# Patient Record
Sex: Female | Born: 1970
Health system: Southern US, Community
[De-identification: ages and names within clinical notes are randomized; demographics above are authoritative.]

## PROBLEM LIST (undated history)

## (undated) DIAGNOSIS — N809 Endometriosis, unspecified: Secondary | ICD-10-CM

## (undated) DIAGNOSIS — N926 Irregular menstruation, unspecified: Secondary | ICD-10-CM

## (undated) DIAGNOSIS — Z8742 Personal history of other diseases of the female genital tract: Secondary | ICD-10-CM

## (undated) DIAGNOSIS — M1990 Unspecified osteoarthritis, unspecified site: Secondary | ICD-10-CM

## (undated) HISTORY — PX: OTHER SURGICAL HISTORY: SHX169

## (undated) HISTORY — PX: DILATION AND CURETTAGE OF UTERUS: SHX78

## (undated) HISTORY — DX: Unspecified osteoarthritis, unspecified site: M19.90

## (undated) HISTORY — DX: Personal history of other diseases of the female genital tract: Z87.42

## (undated) HISTORY — DX: Endometriosis, unspecified: N80.9

## (undated) HISTORY — PX: GALLBLADDER SURGERY: SHX652

## (undated) HISTORY — DX: Irregular menstruation, unspecified: N92.6

---

## 1991-10-07 HISTORY — PX: CHOLECYSTECTOMY: SHX55

## 2001-06-14 ENCOUNTER — Ambulatory Visit (HOSPITAL_COMMUNITY): Admission: RE | Admit: 2001-06-14 | Discharge: 2001-06-14 | Payer: Self-pay | Admitting: Obstetrics and Gynecology

## 2001-06-14 ENCOUNTER — Encounter: Payer: Self-pay | Admitting: Obstetrics and Gynecology

## 2001-08-02 ENCOUNTER — Ambulatory Visit (HOSPITAL_COMMUNITY): Admission: RE | Admit: 2001-08-02 | Discharge: 2001-08-02 | Payer: Self-pay | Admitting: Obstetrics and Gynecology

## 2001-08-02 ENCOUNTER — Encounter: Payer: Self-pay | Admitting: Obstetrics and Gynecology

## 2001-12-31 ENCOUNTER — Encounter (HOSPITAL_COMMUNITY): Admission: AD | Admit: 2001-12-31 | Discharge: 2002-01-04 | Payer: Self-pay | Admitting: Obstetrics and Gynecology

## 2002-01-05 ENCOUNTER — Inpatient Hospital Stay (HOSPITAL_COMMUNITY): Admission: AD | Admit: 2002-01-05 | Discharge: 2002-01-08 | Payer: Self-pay | Admitting: Obstetrics and Gynecology

## 2003-03-27 ENCOUNTER — Other Ambulatory Visit: Admission: RE | Admit: 2003-03-27 | Discharge: 2003-03-27 | Payer: Self-pay | Admitting: Family Medicine

## 2004-03-27 ENCOUNTER — Other Ambulatory Visit: Admission: RE | Admit: 2004-03-27 | Discharge: 2004-03-27 | Payer: Self-pay | Admitting: Internal Medicine

## 2005-03-19 ENCOUNTER — Ambulatory Visit: Payer: Self-pay | Admitting: Family Medicine

## 2005-03-28 ENCOUNTER — Ambulatory Visit: Payer: Self-pay | Admitting: Family Medicine

## 2005-03-28 ENCOUNTER — Other Ambulatory Visit: Admission: RE | Admit: 2005-03-28 | Discharge: 2005-03-28 | Payer: Self-pay | Admitting: Family Medicine

## 2006-03-31 ENCOUNTER — Other Ambulatory Visit: Admission: RE | Admit: 2006-03-31 | Discharge: 2006-03-31 | Payer: Self-pay | Admitting: Internal Medicine

## 2006-03-31 ENCOUNTER — Ambulatory Visit: Payer: Self-pay | Admitting: Family Medicine

## 2007-03-16 ENCOUNTER — Telehealth (INDEPENDENT_AMBULATORY_CARE_PROVIDER_SITE_OTHER): Payer: Self-pay | Admitting: *Deleted

## 2007-03-31 ENCOUNTER — Ambulatory Visit: Payer: Self-pay | Admitting: Internal Medicine

## 2007-04-06 ENCOUNTER — Ambulatory Visit: Payer: Self-pay | Admitting: Internal Medicine

## 2008-04-13 ENCOUNTER — Ambulatory Visit: Payer: Self-pay | Admitting: Internal Medicine

## 2009-04-27 ENCOUNTER — Ambulatory Visit: Payer: Self-pay | Admitting: Internal Medicine

## 2010-05-07 ENCOUNTER — Ambulatory Visit: Payer: Self-pay | Admitting: Internal Medicine

## 2011-02-21 NOTE — Discharge Summary (Signed)
[facility]  Patient:    Sydney French, Sydney French Visit Number: [id number] MRN: [id number]          Service Type: OBS Location: [id number] Attending Physician:  Oliver Pila Dictated by:   Alvino Chapel, M.D. Admit Date:  01/05/2002 Discharge Date: 01/08/2002                             Discharge Summary  DISCHARGE DIAGNOSES:          1. Term pregnancy at 40 weeks, delivered.                               2. Status post vaginal birth after cesarean                                  section.  DISCHARGE MEDICATIONS:        1. Motrin 600 mg p.o. every six hours.                               2. Percocet one to two tablets every four                                  hours p.r.n.  DISCHARGE FOLLOWUP:           The patient is to follow up in approximately six weeks for her routine postpartum exam.  HOSPITAL COURSE:              The patient is a 40 year old, G7, P3-0-3-3, who was admitted at 40 weeks with complaint of contractions every three to five minutes for several hours. She had had no leakage of fluid and her cervical exam on admission was completely effaced, 6 cm and a 0 station. The patient was admitted and received her epidural anesthesia. Pregnancy had been complicated only by a history of a low transverse cesarean section with a successful VBAC after that.  PRENATAL LABORATORY DATA:     O positive, antibody negative, RPR nonreactive, rubella immune, hepatitis B surface antigen negative, GC negative, Chlamydia negative, GBS negative. One-hour glucola 124 and triple screen normal.  PAST OBSTETRIC HISTORY:       In 1993 she had a cesarean section for a breech baby at 6 pounds. In 1994 she had a spontaneous miscarriage. In 1995 she had a successful VBAC of a 7-pound 5-ounce infant. In 1997 she had a spontaneous miscarriage. In 1998 she had a successful VBAC of an 8-pound 2-ounce infant. In 2001 she had a spontaneous abortion.  PAST  GYNECOLOGIC HISTORY:     None.  PAST SURGICAL HISTORY:        C-section and cholecystectomy.  PAST MEDICAL HISTORY:         None.  HOSPITAL COURSE:              On admission she was afebrile with stable vital signs. Fetal heart rate was reactive. Cervix quickly progressed to completely dilated and completely effaced and +1 station. She had rupture of membranes with clear fluid shortly after admission and pushed well with normal spontaneous vaginal delivery of a vigorous female infant over a small second degree laceration. Apgars were 9  and 9. Weight was 8 pounds 5 ounces. Placenta delivered spontaneously. Second degree laceration was repaired with 2-0 Vicryl. Cervix and rectum were intact. The patient did very well postpartum and on postpartum day #2 was afebrile with stable vital signs and was felt stable for discharge home. Dictated by:   Alvino Chapel, M.D. Attending Physician:  Oliver Pila DD:  01/08/02 TD:  01/09/02 Job: [id number]

## 2011-05-14 ENCOUNTER — Ambulatory Visit: Payer: Self-pay | Admitting: Internal Medicine

## 2012-05-19 ENCOUNTER — Ambulatory Visit: Payer: Self-pay | Admitting: Internal Medicine

## 2013-06-10 ENCOUNTER — Ambulatory Visit: Payer: Self-pay | Admitting: Internal Medicine

## 2014-06-27 ENCOUNTER — Ambulatory Visit: Payer: Self-pay | Admitting: Internal Medicine

## 2014-07-04 ENCOUNTER — Ambulatory Visit: Payer: Self-pay | Admitting: Internal Medicine

## 2015-06-19 ENCOUNTER — Other Ambulatory Visit: Payer: Self-pay | Admitting: Internal Medicine

## 2015-06-19 DIAGNOSIS — Z1231 Encounter for screening mammogram for malignant neoplasm of breast: Secondary | ICD-10-CM

## 2015-07-02 ENCOUNTER — Ambulatory Visit
Admission: RE | Admit: 2015-07-02 | Discharge: 2015-07-02 | Disposition: A | Discharge status: Home / Self Care | Payer: BLUE CROSS/BLUE SHIELD | Source: Ambulatory Visit | Attending: Internal Medicine | Admitting: Internal Medicine

## 2015-07-02 DIAGNOSIS — Z1231 Encounter for screening mammogram for malignant neoplasm of breast: Secondary | ICD-10-CM | POA: Diagnosis present

## 2016-02-22 DIAGNOSIS — N925 Other specified irregular menstruation: Secondary | ICD-10-CM | POA: Diagnosis not present

## 2016-02-22 DIAGNOSIS — J029 Acute pharyngitis, unspecified: Secondary | ICD-10-CM | POA: Diagnosis not present

## 2016-02-22 DIAGNOSIS — N924 Excessive bleeding in the premenopausal period: Secondary | ICD-10-CM | POA: Diagnosis not present

## 2016-02-22 DIAGNOSIS — D509 Iron deficiency anemia, unspecified: Secondary | ICD-10-CM | POA: Diagnosis not present

## 2016-02-27 DIAGNOSIS — N924 Excessive bleeding in the premenopausal period: Secondary | ICD-10-CM | POA: Diagnosis not present

## 2016-06-17 ENCOUNTER — Other Ambulatory Visit: Payer: Self-pay | Admitting: Nurse Practitioner

## 2016-06-17 DIAGNOSIS — H60553 Acute reactive otitis externa, bilateral: Secondary | ICD-10-CM | POA: Diagnosis not present

## 2016-06-17 DIAGNOSIS — Z0001 Encounter for general adult medical examination with abnormal findings: Secondary | ICD-10-CM | POA: Diagnosis not present

## 2016-06-17 DIAGNOSIS — N924 Excessive bleeding in the premenopausal period: Secondary | ICD-10-CM | POA: Diagnosis not present

## 2016-06-17 DIAGNOSIS — D509 Iron deficiency anemia, unspecified: Secondary | ICD-10-CM | POA: Diagnosis not present

## 2016-06-17 DIAGNOSIS — Z1231 Encounter for screening mammogram for malignant neoplasm of breast: Secondary | ICD-10-CM

## 2016-06-17 DIAGNOSIS — Z124 Encounter for screening for malignant neoplasm of cervix: Secondary | ICD-10-CM | POA: Diagnosis not present

## 2016-06-18 DIAGNOSIS — N924 Excessive bleeding in the premenopausal period: Secondary | ICD-10-CM | POA: Diagnosis not present

## 2016-07-01 ENCOUNTER — Ambulatory Visit: Payer: BLUE CROSS/BLUE SHIELD

## 2016-07-16 ENCOUNTER — Ambulatory Visit
Admission: RE | Admit: 2016-07-16 | Discharge: 2016-07-16 | Disposition: A | Discharge status: Home / Self Care | Payer: BLUE CROSS/BLUE SHIELD | Source: Ambulatory Visit | Attending: Nurse Practitioner | Admitting: Nurse Practitioner

## 2016-07-16 DIAGNOSIS — Z1231 Encounter for screening mammogram for malignant neoplasm of breast: Secondary | ICD-10-CM | POA: Diagnosis not present

## 2016-07-18 DIAGNOSIS — D509 Iron deficiency anemia, unspecified: Secondary | ICD-10-CM | POA: Diagnosis not present

## 2016-07-18 DIAGNOSIS — N924 Excessive bleeding in the premenopausal period: Secondary | ICD-10-CM | POA: Diagnosis not present

## 2016-08-18 DIAGNOSIS — L700 Acne vulgaris: Secondary | ICD-10-CM | POA: Diagnosis not present

## 2016-08-18 DIAGNOSIS — D2261 Melanocytic nevi of right upper limb, including shoulder: Secondary | ICD-10-CM | POA: Diagnosis not present

## 2016-08-18 DIAGNOSIS — D2262 Melanocytic nevi of left upper limb, including shoulder: Secondary | ICD-10-CM | POA: Diagnosis not present

## 2016-08-18 DIAGNOSIS — D225 Melanocytic nevi of trunk: Secondary | ICD-10-CM | POA: Diagnosis not present

## 2016-08-18 DIAGNOSIS — L570 Actinic keratosis: Secondary | ICD-10-CM | POA: Diagnosis not present

## 2016-12-09 DIAGNOSIS — N760 Acute vaginitis: Secondary | ICD-10-CM | POA: Diagnosis not present

## 2016-12-09 DIAGNOSIS — R300 Dysuria: Secondary | ICD-10-CM | POA: Diagnosis not present

## 2017-04-20 ENCOUNTER — Ambulatory Visit: Payer: Self-pay | Admitting: Medical

## 2017-04-20 ENCOUNTER — Encounter: Payer: Self-pay | Admitting: Medical

## 2017-04-20 VITALS — BP 102/76 | HR 98 | Temp 99.1°F

## 2017-04-20 DIAGNOSIS — B349 Viral infection, unspecified: Secondary | ICD-10-CM

## 2017-04-20 NOTE — Progress Notes (Signed)
   Subjective:    Patient ID: Sydney French, female    DOB: 1971-05-12, 46 y.o.   MRN: [id number]  HPI 46  Yo started about one week ago. Runny nose clear coughing some phelgm but not coughing it out, sneezing , fever Friday night but did not take the temperature, had chills. Taking Mucinex max strength with some relief. Flying out this weekend for a reunion and does not want to be sick. Using  Etodolac 500 mg aching and pains , helps. She states she has mild bodyaches and a mild sore throat.   Review of Systems  Constitutional: Positive for appetite change, chills, fatigue and fever.  HENT: Positive for rhinorrhea, sinus pressure, sneezing, sore throat and voice change. Negative for congestion, ear pain and sinus pain.   Eyes: Negative for discharge and itching.  Respiratory: Positive for cough and shortness of breath.   Cardiovascular: Negative for chest pain.  Gastrointestinal: Negative for abdominal pain.  Genitourinary: Negative for dysuria and hematuria.  Musculoskeletal: Positive for myalgias.  Skin: Negative for rash.  Neurological: Negative for dizziness and syncope.  Hematological: Positive for adenopathy.   No shortness of breath now. Drinking more and urinating more.   felt a little  "out of it last night at the game". Objective:   Physical Exam  Constitutional: She is oriented to person, place, and time. She appears well-developed and well-nourished.  HENT:  Head: Normocephalic and atraumatic.  Right Ear: Hearing, external ear and ear canal normal.  Left Ear: Hearing, external ear and ear canal normal. A middle ear effusion is present.  Mouth/Throat: Uvula is midline and mucous membranes are normal. Posterior oropharyngeal erythema present. No oropharyngeal exudate or posterior oropharyngeal edema.  Eyes: Pupils are equal, round, and reactive to light. Conjunctivae and EOM are normal.  Neck: Normal range of motion. Neck supple.  Cardiovascular: Normal rate and regular  rhythm.  Exam reveals no gallop and no friction rub.   No murmur heard. Pulmonary/Chest: Effort normal and breath sounds normal.  Musculoskeletal: Normal range of motion.  Lymphadenopathy:    She has cervical adenopathy.  Neurological: She is alert and oriented to person, place, and time.  Skin: Skin is warm and dry.  Psychiatric: She has a normal mood and affect. Her behavior is normal. Judgment and thought content normal.  Nursing note and vitals reviewed.    Wax obscuring  1/2 of the left TM , wax obscuring all the right TM  Pharynx very edge of posterior pharynx slightly red.     Assessment & Plan:   Viral infection. Rest , increase fluids, OTC Motrin or Tylenol, take as directed.  Reviewed with patient to stop the Max strength Mucinex causing mouth to be dry. Try OTC Zyrtec or Claritin take as directed.  Return in  4 days if not improving.  Cerumen impaction Recommended OTC Debrox otic drops  5-10 drops to each ear twice daily x  4 days rinse in shower.

## 2017-04-20 NOTE — Patient Instructions (Signed)
Viral Illness, Adult Viruses are tiny germs that can get into a person's body and cause illness. There are many different types of viruses, and they cause many types of illness. Viral illnesses can range from mild to severe. They can affect various parts of the body. Common illnesses that are caused by a virus include colds and the flu. Viral illnesses also include serious conditions such as HIV/AIDS (human immunodeficiency virus/acquired immunodeficiency syndrome). A few viruses have been linked to certain cancers. What are the causes? Many types of viruses can cause illness. Viruses invade cells in your body, multiply, and cause the infected cells to malfunction or die. When the cell dies, it releases more of the virus. When this happens, you develop symptoms of the illness, and the virus continues to spread to other cells. If the virus takes over the function of the cell, it can cause the cell to divide and grow out of control, as is the case when a virus causes cancer. Different viruses get into the body in different ways. You can get a virus by:  Swallowing food or water that is contaminated with the virus.  Breathing in droplets that have been coughed or sneezed into the air by an infected person.  Touching a surface that has been contaminated with the virus and then touching your eyes, nose, or mouth.  Being bitten by an insect or animal that carries the virus.  Having sexual contact with a person who is infected with the virus.  Being exposed to blood or fluids that contain the virus, either through an open cut or during a transfusion.  If a virus enters your body, your body's defense system (immune system) will try to fight the virus. You may be at higher risk for a viral illness if your immune system is weak. What are the signs or symptoms? Symptoms vary depending on the type of virus and the location of the cells that it invades. Common symptoms of the main types of viral illnesses  include: Cold and flu viruses  Fever.  Headache.  Sore throat.  Muscle aches.  Nasal congestion.  Cough. Digestive system (gastrointestinal) viruses  Fever.  Abdominal pain.  Nausea.  Diarrhea. Liver viruses (hepatitis)  Loss of appetite.  Tiredness.  Yellowing of the skin (jaundice). Brain and spinal cord viruses  Fever.  Headache.  Stiff neck.  Nausea and vomiting.  Confusion or sleepiness. Skin viruses  Warts.  Itching.  Rash. Sexually transmitted viruses  Discharge.  Swelling.  Redness.  Rash. How is this treated? Viruses can be difficult to treat because they live within cells. Antibiotic medicines do not treat viruses because these drugs do not get inside cells. Treatment for a viral illness may include:  Resting and drinking plenty of fluids.  Medicines to relieve symptoms. These can include over-the-counter medicine for pain and fever, medicines for cough or congestion, and medicines to relieve diarrhea.  Antiviral medicines. These drugs are available only for certain types of viruses. They may help reduce flu symptoms if taken early. There are also many antiviral medicines for hepatitis and HIV/AIDS.  Some viral illnesses can be prevented with vaccinations. A common example is the flu shot. Follow these instructions at home: Medicines   Take over-the-counter and prescription medicines only as told by your health care provider.  If you were prescribed an antiviral medicine, take it as told by your health care provider. Do not stop taking the medicine even if you start to feel better.  Be aware   of when antibiotics are needed and when they are not needed. Antibiotics do not treat viruses. If your health care provider thinks that you may have a bacterial infection as well as a viral infection, you may get an antibiotic. ? Do not ask for an antibiotic prescription if you have been diagnosed with a viral illness. That will not make your  illness go away faster. ? Frequently taking antibiotics when they are not needed can lead to antibiotic resistance. When this develops, the medicine no longer works against the bacteria that it normally fights. General instructions  Drink enough fluids to keep your urine clear or pale yellow.  Rest as much as possible.  Return to your normal activities as told by your health care provider. Ask your health care provider what activities are safe for you.  Keep all follow-up visits as told by your health care provider. This is important. How is this prevented? Take these actions to reduce your risk of viral infection:  Eat a healthy diet and get enough rest.  Wash your hands often with soap and water. This is especially important when you are in public places. If soap and water are not available, use hand sanitizer.  Avoid close contact with friends and family who have a viral illness.  If you travel to areas where viral gastrointestinal infection is common, avoid drinking water or eating raw food.  Keep your immunizations up to date. Get a flu shot every year as told by your health care provider.  Do not share toothbrushes, nail clippers, razors, or needles with other people.  Always practice safe sex.  Contact a health care provider if:  You have symptoms of a viral illness that do not go away.  Your symptoms come back after going away.  Your symptoms get worse. Get help right away if:  You have trouble breathing.  You have a severe headache or a stiff neck.  You have severe vomiting or abdominal pain. This information is not intended to replace advice given to you by your health care provider. Make sure you discuss any questions you have with your health care provider. Document Released: 02/01/2016 Document Revised: 03/05/2016 Document Reviewed: 02/01/2016 Elsevier Interactive Patient Education  2018 Elsevier Inc.  

## 2017-07-30 DIAGNOSIS — F411 Generalized anxiety disorder: Secondary | ICD-10-CM | POA: Diagnosis not present

## 2017-07-30 DIAGNOSIS — R109 Unspecified abdominal pain: Secondary | ICD-10-CM | POA: Diagnosis not present

## 2017-07-30 DIAGNOSIS — Z0001 Encounter for general adult medical examination with abnormal findings: Secondary | ICD-10-CM | POA: Diagnosis not present

## 2017-07-30 DIAGNOSIS — Z124 Encounter for screening for malignant neoplasm of cervix: Secondary | ICD-10-CM | POA: Diagnosis not present

## 2017-08-03 ENCOUNTER — Other Ambulatory Visit: Payer: Self-pay | Admitting: Nurse Practitioner

## 2017-08-03 DIAGNOSIS — Z1231 Encounter for screening mammogram for malignant neoplasm of breast: Secondary | ICD-10-CM

## 2017-08-13 DIAGNOSIS — Z0001 Encounter for general adult medical examination with abnormal findings: Secondary | ICD-10-CM | POA: Diagnosis not present

## 2017-08-13 DIAGNOSIS — E559 Vitamin D deficiency, unspecified: Secondary | ICD-10-CM | POA: Diagnosis not present

## 2017-08-13 DIAGNOSIS — D509 Iron deficiency anemia, unspecified: Secondary | ICD-10-CM | POA: Diagnosis not present

## 2017-08-18 ENCOUNTER — Ambulatory Visit
Admission: RE | Admit: 2017-08-18 | Discharge: 2017-08-18 | Disposition: A | Discharge status: Home / Self Care | Payer: BLUE CROSS/BLUE SHIELD | Source: Ambulatory Visit | Attending: Nurse Practitioner | Admitting: Nurse Practitioner

## 2017-08-18 DIAGNOSIS — Z1231 Encounter for screening mammogram for malignant neoplasm of breast: Secondary | ICD-10-CM | POA: Diagnosis not present

## 2017-08-24 DIAGNOSIS — Z8742 Personal history of other diseases of the female genital tract: Secondary | ICD-10-CM | POA: Diagnosis not present

## 2017-08-24 DIAGNOSIS — R1031 Right lower quadrant pain: Secondary | ICD-10-CM | POA: Diagnosis not present

## 2017-10-28 ENCOUNTER — Ambulatory Visit: Payer: Self-pay | Admitting: *Deleted

## 2017-10-28 DIAGNOSIS — Z0000 Encounter for general adult medical examination without abnormal findings: Secondary | ICD-10-CM

## 2017-10-29 ENCOUNTER — Other Ambulatory Visit: Payer: Self-pay

## 2017-10-29 MED ORDER — FERROUS FUMARATE 324 (106 FE) MG PO TABS: 1.0000 | ORAL_TABLET | Freq: Every day | ORAL | 3 refills | Status: DC

## 2018-01-20 ENCOUNTER — Telehealth: Payer: Self-pay | Admitting: Medical

## 2018-01-20 NOTE — Telephone Encounter (Signed)
Called patient to review patient travel paperwork and refer onto travel clinic.

## 2018-01-20 NOTE — Telephone Encounter (Signed)
Called and left message for her to return my phone call , to review her travel  [place]. And refer her to a travel clinic.

## 2018-03-05 ENCOUNTER — Encounter: Payer: Self-pay | Admitting: Nurse Practitioner

## 2018-03-05 ENCOUNTER — Other Ambulatory Visit: Payer: Self-pay | Admitting: Nurse Practitioner

## 2018-03-05 ENCOUNTER — Ambulatory Visit: Payer: BLUE CROSS/BLUE SHIELD | Admitting: Nurse Practitioner

## 2018-03-05 VITALS — BP 119/79 | HR 83 | Resp 16 | Ht 64.0 in | Wt 111.8 lb

## 2018-03-05 DIAGNOSIS — D259 Leiomyoma of uterus, unspecified: Secondary | ICD-10-CM

## 2018-03-05 DIAGNOSIS — R809 Proteinuria, unspecified: Secondary | ICD-10-CM | POA: Diagnosis not present

## 2018-03-05 DIAGNOSIS — N390 Urinary tract infection, site not specified: Secondary | ICD-10-CM | POA: Diagnosis not present

## 2018-03-05 DIAGNOSIS — R300 Dysuria: Secondary | ICD-10-CM

## 2018-03-05 DIAGNOSIS — R7301 Impaired fasting glucose: Secondary | ICD-10-CM | POA: Diagnosis not present

## 2018-03-05 DIAGNOSIS — N959 Unspecified menopausal and perimenopausal disorder: Secondary | ICD-10-CM | POA: Diagnosis not present

## 2018-03-05 DIAGNOSIS — N926 Irregular menstruation, unspecified: Secondary | ICD-10-CM

## 2018-03-05 LAB — POCT URINALYSIS DIPSTICK
Blood, UA: NEGATIVE
Glucose, UA: NEGATIVE
LEUKOCYTES UA: NEGATIVE
NITRITE UA: NEGATIVE
PH UA: 5 (ref 5.0–8.0)
Protein, UA: POSITIVE — AB
SPEC GRAV UA: 1.01 (ref 1.010–1.025)
UROBILINOGEN UA: 0.2 U/dL

## 2018-03-05 NOTE — Progress Notes (Signed)
[facility] [place], [place] [address]  Internal MEDICINE  Office Visit Note  Patient Name: Sydney French  [id number]  [id number]  Date of Service: 03/24/2018  Chief Complaint  Patient presents with  . Abdominal Pain    occassional urgency off and on for about two weeks  . Hot Flashes    frequently going on for about a month  . Nail Problem    concerned about both pinky toes not sure if she nees to put something on nails for fungus  . Immunizations    traveling and wants to get some vaccinations before traveling outside of the country     The patient will be travelling to [place] in 2 weeks. She will need vritten prescriptions for hepatitis A vaccine series. She also needs this for vaccine for Typhoid. Would like to have prescriptions for both the IM injection and the oral vaccine. She is unsure of which vaccine the health center will be able to get for her.   Abdominal Pain  This is a new problem. The current episode started 1 to 4 weeks ago. The onset quality is gradual. The problem occurs intermittently (gradually becoming more frequent). The problem has been gradually worsening. The pain is located in the RLQ. The pain is at a severity of 3/10. The pain is mild. The quality of the pain is aching and cramping. The abdominal pain does not radiate. Associated symptoms include dysuria, frequency, headaches and nausea. Pertinent negatives include no arthralgias, constipation, diarrhea, fever, flatus or vomiting. Nothing aggravates the pain. The pain is relieved by nothing.        Current Medication:  Outpatient Encounter Medications as of 03/05/2018  Medication Sig  . etodolac (LODINE) 500 MG tablet Take 500 mg by mouth daily.  Marland Kitchen [place] FAST-MAX 5-10-200-325 MG CAPS Take by mouth.  . [DISCONTINUED] Ferrous Fumarate (HEMOCYTE) 324 (106 Fe) MG TABS tablet Take 1 tablet (106 mg of iron total) by mouth daily.  . nitrofurantoin, macrocrystal-monohydrate,  (MACROBID) 100 MG capsule Take 1 capsule (100 mg total) by mouth 2 (two) times daily.   No facility-administered encounter medications on file as of 03/05/2018.       Medical History: Past Medical History:  Diagnosis Date  . Osteoarthritis      Today's Vitals   03/05/18 1140  BP: 119/79  Pulse: 83  Resp: 16  SpO2: 100%  Weight: 111 lb 12.8 oz (50.7 kg)  Height: 5\' 4"  (1.626 m)    Review of Systems  Constitutional: Positive for fatigue. Negative for chills, fever and unexpected weight change.  HENT: Negative for congestion, postnasal drip, rhinorrhea, sneezing and sore throat.   Eyes: Negative.  Negative for redness.  Respiratory: Negative for cough, chest tightness, shortness of breath and wheezing.   Cardiovascular: Negative for chest pain and palpitations.  Gastrointestinal: Positive for abdominal pain and nausea. Negative for constipation, diarrhea, flatus and vomiting.  Endocrine: Positive for heat intolerance.  Genitourinary: Positive for dysuria, frequency and menstrual problem.  Musculoskeletal: Negative for arthralgias, back pain, joint swelling and neck pain.  Skin: Negative for rash.  Allergic/Immunologic: Negative.   Neurological: Positive for headaches. Negative for tremors and numbness.  Hematological: Negative for adenopathy. Does not bruise/bleed easily.  Psychiatric/Behavioral: Negative for behavioral problems (Depression), sleep disturbance and suicidal ideas. The patient is not nervous/anxious.     Physical Exam  Constitutional: She is oriented to person, place, and time. She appears well-developed and well-nourished. No distress.  HENT:  Head:  Normocephalic and atraumatic.  Mouth/Throat: Oropharynx is clear and moist. No oropharyngeal exudate.  Eyes: Pupils are equal, round, and reactive to light. EOM are normal.  Neck: Normal range of motion. Neck supple. No JVD present. No tracheal deviation present. No thyromegaly present.  Cardiovascular: Normal  rate, regular rhythm and normal heart sounds. Exam reveals no gallop and no friction rub.  No murmur heard. Pulmonary/Chest: Effort normal and breath sounds normal. No respiratory distress. She has no wheezes. She has no rales. She exhibits no tenderness.  Abdominal: Soft. Normal appearance and bowel sounds are normal.  Genitourinary:  Genitourinary Comments: U/a positive for large amount of protein.   Musculoskeletal: Normal range of motion.  Lymphadenopathy:    She has no cervical adenopathy.  Neurological: She is alert and oriented to person, place, and time. No cranial nerve deficit.  Skin: Skin is warm and dry. She is not diaphoretic.  Psychiatric: She has a normal mood and affect. Her behavior is normal. Judgment and thought content normal.  Nursing note and vitals reviewed.  Assessment/Plan: 1. Urinary tract infection without hematuria, site unspecified Start macrobid 100mg  twice daily for 10 days, send urine for culture and sensitivity and adjust antibiotics as indicated.  - CULTURE, URINE COMPREHENSIVE - nitrofurantoin, macrocrystal-monohydrate, (MACROBID) 100 MG capsule; Take 1 capsule (100 mg total) by mouth 2 (two) times daily.  Dispense: 20 capsule; Refill: 0  2. Dysuria - POCT Urinalysis Dipstick  3. Proteinuria, unspecified type Moderate protein in u/a today. Treat for infection. Recheck urine at next visit.   4. Unspecified menopausal and perimenopausal disorder Check reproductive hormones.   5. Irregular menstrual cycle Check reproductive hormones.   6. Leiomyoma of body of uterus Will get pelvic ultrasound for further evaluation. Refer to GYN as indicated.  - US Transvaginal Non-OB; Future  General Counseling: lakynn halvorsen understanding of the findings of todays visit and agrees with plan of treatment. I have discussed any further diagnostic evaluation that may be needed or ordered today. We also reviewed her medications today. she has been encouraged to  call the office with any questions or concerns that should arise related to todays visit.    Counseling:  This patient was seen by Leretha Pol, FNP- C in Collaboration with Dr Lavera Guise as a part of collaborative care agreement   Orders Placed This Encounter  Procedures  . CULTURE, URINE COMPREHENSIVE  . US Transvaginal Non-OB  . POCT Urinalysis Dipstick    Meds ordered this encounter  Medications  . nitrofurantoin, macrocrystal-monohydrate, (MACROBID) 100 MG capsule    Sig: Take 1 capsule (100 mg total) by mouth 2 (two) times daily.    Dispense:  20 capsule    Refill:  0    Order Specific Question:   Supervising Provider    Answer:   Lavera Guise [[id number]]    Time spent: 20 Minutes

## 2018-03-06 LAB — CBC
HEMATOCRIT: 37.7 % (ref 34.0–46.6)
Hemoglobin: 12.2 g/dL (ref 11.1–15.9)
MCH: 29.5 pg (ref 26.6–33.0)
MCHC: 32.4 g/dL (ref 31.5–35.7)
MCV: 91 fL (ref 79–97)
Platelets: 270 10*3/uL (ref 150–450)
RBC: 4.14 x10E6/uL (ref 3.77–5.28)
RDW: 14.4 % (ref 12.3–15.4)
WBC: 6.1 10*3/uL (ref 3.4–10.8)

## 2018-03-06 LAB — BASIC METABOLIC PANEL
BUN / CREAT RATIO: 21 (ref 9–23)
BUN: 13 mg/dL (ref 6–24)
CO2: 24 mmol/L (ref 20–29)
Calcium: 9.6 mg/dL (ref 8.7–10.2)
Chloride: 102 mmol/L (ref 96–106)
Creatinine, Ser: 0.63 mg/dL (ref 0.57–1.00)
GFR, EST AFRICAN AMERICAN: 122 mL/min/{1.73_m2} (ref >59)
GFR, EST NON AFRICAN AMERICAN: 106 mL/min/{1.73_m2} (ref >59)
Glucose: 148 mg/dL — ABNORMAL HIGH (ref 65–99)
POTASSIUM: 4.1 mmol/L (ref 3.5–5.2)
SODIUM: 140 mmol/L (ref 134–144)

## 2018-03-06 LAB — ESTRADIOL

## 2018-03-06 LAB — T4, FREE: Free T4: 1.35 ng/dL (ref 0.82–1.77)

## 2018-03-06 LAB — FSH/LH
FSH: 115.8 m[IU]/mL
LH: 55.4 m[IU]/mL

## 2018-03-06 LAB — PROLACTIN: Prolactin: 9.1 ng/mL (ref 4.8–23.3)

## 2018-03-06 LAB — TSH: TSH: 0.591 u[IU]/mL (ref 0.450–4.500)

## 2018-03-08 ENCOUNTER — Ambulatory Visit: Payer: Self-pay | Admitting: Medical

## 2018-03-08 VITALS — Temp 98.7°F

## 2018-03-09 LAB — CULTURE, URINE COMPREHENSIVE

## 2018-03-09 MED ORDER — HEPATITIS A VACCINE 1440 EL U/ML IM SUSP
1.0000 mL | Freq: Once | INTRAMUSCULAR | Status: AC
Start: 2018-03-08 — End: 2018-03-08
  Administered 2018-03-08: 1440 [IU] via INTRAMUSCULAR

## 2018-03-09 MED ORDER — TYPHOID VI POLYSACCHARIDE VACC 25 MCG/0.5ML IM SOLN
0.5000 mL | Freq: Once | INTRAMUSCULAR | Status: AC
  Administered 2018-03-08: 0.5 mL via INTRAMUSCULAR

## 2018-03-10 MED ORDER — NITROFURANTOIN MONOHYD MACRO 100 MG PO CAPS: 100.0000 mg | ORAL_CAPSULE | Freq: Two times a day (BID) | ORAL | 0 refills | Status: DC

## 2018-03-10 NOTE — Progress Notes (Signed)
Hey. Can you see if they can add HgbA1c to these labs.  Dx is R73.01. Sydney French

## 2018-03-11 ENCOUNTER — Telehealth: Payer: Self-pay | Admitting: Nurse Practitioner

## 2018-03-11 NOTE — Telephone Encounter (Signed)
-----   Message from Ronnell Freshwater, NP sent at 03/10/2018  3:13 PM EDT ----- [name]. Can you see if they can add HgbA1c to these labs.  Dx is R73.01. Toya Smothers

## 2018-03-11 NOTE — Telephone Encounter (Signed)
Left message for pt informing her that we are waiting for one more result from her blood work and we will call her back with the results as soon as they are in and reviewed by the doctor

## 2018-03-11 NOTE — Telephone Encounter (Signed)
Called lab and asked to add A1c to order.

## 2018-03-11 NOTE — Telephone Encounter (Signed)
-----   Message from Ronnell Freshwater, NP sent at 03/10/2018  3:17 PM EDT ----- Please let the patient know that urine did show evidence of mild infection. I added dose of macrobid 100mg  twice daily to her meds and I sent this to the pharmacy. Other labs look ok. Hormone levels do indicate menopause. thanks

## 2018-03-11 NOTE — Telephone Encounter (Signed)
Pt informed of her urine results

## 2018-03-11 NOTE — Telephone Encounter (Signed)
I am waiting on one more result.

## 2018-03-12 ENCOUNTER — Other Ambulatory Visit: Payer: Self-pay

## 2018-03-12 MED ORDER — FERROUS FUMARATE 324 (106 FE) MG PO TABS: 1.0000 | ORAL_TABLET | Freq: Every day | ORAL | 3 refills | Status: DC

## 2018-03-13 LAB — SPECIMEN STATUS REPORT

## 2018-03-13 LAB — HGB A1C W/O EAG: HEMOGLOBIN A1C: 5.5 % (ref 4.8–5.6)

## 2018-03-17 ENCOUNTER — Telehealth: Payer: Self-pay | Admitting: Nurse Practitioner

## 2018-03-17 NOTE — Progress Notes (Signed)
Hey. Can you let the patient know that labs do indicate menopauses. Other labs were good. Sugar was a little high, but HgbA1c does not indicate diabetes. Thanks.

## 2018-03-17 NOTE — Telephone Encounter (Signed)
-----   Message from Ronnell Freshwater, NP sent at 03/17/2018  9:04 AM EDT ----- [name]. Can you let the patient know that labs do indicate menopauses. Other labs were good. Sugar was a little high, but HgbA1c does not indicate diabetes. Thanks.

## 2018-03-17 NOTE — Telephone Encounter (Signed)
-----   Message from Sydney Freshwater, NP sent at 03/17/2018  9:04 AM EDT ----- [name]. Can you let the patient know that labs do indicate menopauses. Other labs were good. Sugar was a little high, but HgbA1c does not indicate diabetes. Thanks.

## 2018-03-17 NOTE — Telephone Encounter (Signed)
Lm for patient to return call.

## 2018-03-18 NOTE — Telephone Encounter (Signed)
Please let her know that I would like her to try Amberen which is over the counter to help women suffering from menopausal symptoms. Many of my patients have really liked this. Has helped tremendously without hormones or use of antidepressants. Thanks.

## 2018-03-18 NOTE — Telephone Encounter (Signed)
Pt called and was advised on otc medication

## 2018-03-24 DIAGNOSIS — N926 Irregular menstruation, unspecified: Secondary | ICD-10-CM | POA: Insufficient documentation

## 2018-03-24 DIAGNOSIS — R809 Proteinuria, unspecified: Secondary | ICD-10-CM | POA: Insufficient documentation

## 2018-03-24 DIAGNOSIS — N959 Unspecified menopausal and perimenopausal disorder: Secondary | ICD-10-CM | POA: Insufficient documentation

## 2018-03-24 DIAGNOSIS — R300 Dysuria: Secondary | ICD-10-CM | POA: Insufficient documentation

## 2018-03-24 DIAGNOSIS — N390 Urinary tract infection, site not specified: Secondary | ICD-10-CM | POA: Insufficient documentation

## 2018-03-24 DIAGNOSIS — D259 Leiomyoma of uterus, unspecified: Secondary | ICD-10-CM | POA: Insufficient documentation

## 2018-04-15 ENCOUNTER — Other Ambulatory Visit: Payer: Self-pay | Admitting: Nurse Practitioner

## 2018-04-15 DIAGNOSIS — R102 Pelvic and perineal pain: Secondary | ICD-10-CM

## 2018-04-16 ENCOUNTER — Other Ambulatory Visit: Payer: Self-pay

## 2018-04-20 ENCOUNTER — Ambulatory Visit: Payer: BLUE CROSS/BLUE SHIELD

## 2018-07-19 ENCOUNTER — Other Ambulatory Visit: Payer: Self-pay | Admitting: Nurse Practitioner

## 2018-08-03 ENCOUNTER — Ambulatory Visit (INDEPENDENT_AMBULATORY_CARE_PROVIDER_SITE_OTHER): Payer: BLUE CROSS/BLUE SHIELD | Admitting: Nurse Practitioner

## 2018-08-03 ENCOUNTER — Encounter: Payer: Self-pay | Admitting: Nurse Practitioner

## 2018-08-03 VITALS — BP 129/77 | HR 82 | Resp 16 | Ht 64.0 in | Wt 113.2 lb

## 2018-08-03 DIAGNOSIS — Z0001 Encounter for general adult medical examination with abnormal findings: Secondary | ICD-10-CM | POA: Diagnosis not present

## 2018-08-03 DIAGNOSIS — D259 Leiomyoma of uterus, unspecified: Secondary | ICD-10-CM

## 2018-08-03 DIAGNOSIS — Z124 Encounter for screening for malignant neoplasm of cervix: Secondary | ICD-10-CM | POA: Diagnosis not present

## 2018-08-03 DIAGNOSIS — R300 Dysuria: Secondary | ICD-10-CM

## 2018-08-03 DIAGNOSIS — Z1239 Encounter for other screening for malignant neoplasm of breast: Secondary | ICD-10-CM

## 2018-08-03 DIAGNOSIS — D509 Iron deficiency anemia, unspecified: Secondary | ICD-10-CM

## 2018-08-03 DIAGNOSIS — N926 Irregular menstruation, unspecified: Secondary | ICD-10-CM

## 2018-08-03 DIAGNOSIS — M150 Primary generalized (osteo)arthritis: Secondary | ICD-10-CM

## 2018-08-03 MED ORDER — ETODOLAC 500 MG PO TABS: 500.0000 mg | ORAL_TABLET | Freq: Every day | ORAL | 3 refills | Status: DC

## 2018-08-03 MED ORDER — FERROUS FUMARATE 324 (106 FE) MG PO TABS: 1.0000 | ORAL_TABLET | Freq: Every day | ORAL | 5 refills | Status: DC

## 2018-08-03 NOTE — Progress Notes (Signed)
[facility] [place], [place] [address]  Internal MEDICINE  Office Visit Note  Patient Name: Sydney French  [id number]  [id number]  Date of Service: 08/04/2018   Pt is here for routine health maintenance examination   Chief Complaint  Patient presents with  . Annual Exam    pt has had abdominal pain that occasionally comes, pt states that when she push on it it becomes more painful. pt has a cold that has been lingering for a few days, been taking OTC medication  . Gynecologic Exam  . Quality Metric Gaps    booster shot, pt will get flu vaccine in a few months     The patient travelled to [place] this summer. She did receive the first part of hepatitis A vaccine series. She is due to have a booster for this in December. She will get tis at [facility], where she works. She plans to get a flu shot at the same time.  She continues to have right lower quadrant and suprapubic tenderness, which is intermittent in nature. Worse around the times she gets a menstrual cycle. Cycles are getting further apart. Some are very light, however some are very heavy in nature. Prior labs did indicate that she is perimenopausal. She has started spotting this morning. She was sent up to have a transvaginal pelvic ultrasound and this had to be cancelled. She has had this rescheduled.   Abdominal Pain  This is a recurrent problem. The current episode started more than 1 month ago. The onset quality is gradual. The problem occurs intermittently (gradually becoming more frequent). The problem has been gradually worsening. The pain is located in the RLQ and suprapubic region. The pain is at a severity of 3/10. The pain is mild. The quality of the pain is aching and cramping. The abdominal pain does not radiate. Associated symptoms include headaches and nausea. Pertinent negatives include no arthralgias, constipation, diarrhea, dysuria, fever, flatus, frequency or vomiting. Nothing aggravates  the pain. The pain is relieved by nothing. Treatments tried: ibuprofen which has helped some.  The treatment provided mild relief.    Current Medication: Outpatient Encounter Medications as of 08/03/2018  Medication Sig  . diphenhydrAMINE HCl (BENADRYL ALLERGY PO) Take by mouth.  Marland Kitchen ibuprofen (ADVIL) 200 MG tablet Take 200 mg by mouth every 6 (six) hours as needed.  . etodolac (LODINE) 500 MG tablet Take 1 tablet (500 mg total) by mouth daily.  . Ferrous Fumarate (HEMOCYTE) 324 (106 Fe) MG TABS tablet Take 1 tablet (106 mg of iron total) by mouth daily.  Marland Kitchen [place] FAST-MAX 5-10-200-325 MG CAPS Take by mouth.  . nitrofurantoin, macrocrystal-monohydrate, (MACROBID) 100 MG capsule Take 1 capsule (100 mg total) by mouth 2 (two) times daily. (Patient not taking: Reported on 08/03/2018)  . [DISCONTINUED] etodolac (LODINE) 500 MG tablet Take 500 mg by mouth daily.  . [DISCONTINUED] Ferrous Fumarate (HEMOCYTE) 324 (106 Fe) MG TABS tablet Take 1 tablet (106 mg of iron total) by mouth daily.   No facility-administered encounter medications on file as of 08/03/2018.     Surgical History: Past Surgical History:  Procedure Laterality Date  . child birth natural     1995, 1998, 2003  . CHOLECYSTECTOMY  1993    Medical History: Past Medical History:  Diagnosis Date  . Osteoarthritis     Family History: Family History  Problem Relation Age of Onset  . Asthma Neg Hx   . Breast cancer Neg Hx   .  Stroke Neg Hx   . Glaucoma Neg Hx   . Arthritis Neg Hx       Review of Systems  Constitutional: Negative for chills, fatigue, fever and unexpected weight change.  HENT: Negative for congestion, postnasal drip, rhinorrhea, sneezing and sore throat.   Eyes: Negative.  Negative for redness.  Respiratory: Negative for cough, chest tightness, shortness of breath and wheezing.   Cardiovascular: Negative for chest pain and palpitations.  Gastrointestinal: Positive for abdominal pain and nausea.  Negative for constipation, diarrhea, flatus and vomiting.       Intermittent.   Endocrine: Positive for heat intolerance. Negative for cold intolerance, polydipsia, polyphagia and polyuria.  Genitourinary: Positive for menstrual problem. Negative for dysuria and frequency.  Musculoskeletal: Negative for arthralgias, back pain, joint swelling and neck pain.  Skin: Negative for rash.  Allergic/Immunologic: Negative.   Neurological: Positive for headaches. Negative for tremors and numbness.  Hematological: Negative for adenopathy. Does not bruise/bleed easily.  Psychiatric/Behavioral: Negative for behavioral problems (Depression), sleep disturbance and suicidal ideas. The patient is not nervous/anxious.     Today's Vitals   08/03/18 1148  BP: 129/77  Pulse: 82  Resp: 16  SpO2: 100%  Weight: 113 lb 3.2 oz (51.3 kg)  Height: 5\' 4"  (1.626 m)    Physical Exam  Constitutional: She is oriented to person, place, and time. She appears well-developed and well-nourished. No distress.  HENT:  Head: Normocephalic and atraumatic.  Nose: Nose normal.  Mouth/Throat: Oropharynx is clear and moist. No oropharyngeal exudate.  Eyes: Pupils are equal, round, and reactive to light. Conjunctivae and EOM are normal.  Neck: Normal range of motion. Neck supple. No JVD present. No tracheal deviation present. No thyromegaly present.  Cardiovascular: Normal rate, regular rhythm, normal heart sounds and intact distal pulses. Exam reveals no gallop and no friction rub.  No murmur heard. Pulmonary/Chest: Effort normal and breath sounds normal. No respiratory distress. She has no wheezes. She has no rales. She exhibits no tenderness. Right breast exhibits no inverted nipple, no mass, no nipple discharge, no skin change and no tenderness. Left breast exhibits no inverted nipple, no mass, no nipple discharge, no skin change and no tenderness.  Abdominal: Soft. Normal appearance and bowel sounds are normal. There is  tenderness in the right lower quadrant and suprapubic area. There is no rigidity, no rebound and no guarding.  Genitourinary: Vagina normal and uterus normal.  Genitourinary Comments: No tenderness, masses, or organomeglay present during bimanual exam . Vaginal bleeding is present.   Musculoskeletal: Normal range of motion.  Lymphadenopathy:    She has no cervical adenopathy.  Neurological: She is alert and oriented to person, place, and time. No cranial nerve deficit.  Skin: Skin is warm and dry. Capillary refill takes less than 2 seconds. She is not diaphoretic.  Psychiatric: She has a normal mood and affect. Her behavior is normal. Judgment and thought content normal.  Nursing note and vitals reviewed.    LABS: Recent Results (from the past 2160 hour(s))  UA/M w/rflx Culture, Routine     Status: None   Collection Time: 08/03/18 11:23 AM  Result Value Ref Range   Specific Gravity, UA 1.020 1.005 - 1.030   pH, UA 6.0 5.0 - 7.5   Color, UA Yellow Yellow   Appearance Ur Clear Clear   Leukocytes, UA Negative Negative   Protein, UA Negative Negative/Trace   Glucose, UA Negative Negative   Ketones, UA Negative Negative   RBC, UA Negative Negative  Bilirubin, UA Negative Negative   Urobilinogen, Ur 0.2 0.2 - 1.0 mg/dL   Nitrite, UA Negative Negative   Microscopic Examination Comment     Comment: Microscopic follows if indicated.   Microscopic Examination See below:     Comment: Microscopic was indicated and was performed.   Urinalysis Reflex Comment     Comment: This specimen will not reflex to a Urine Culture.  Microscopic Examination     Status: None   Collection Time: 08/03/18 11:23 AM  Result Value Ref Range   WBC, UA 0-5 0 - 5 /hpf   RBC, UA 0-2 0 - 2 /hpf   Epithelial Cells (non renal) 0-10 0 - 10 /hpf   Casts None seen None seen /lpf   Mucus, UA Present Not Estab.   Bacteria, UA Few None seen/Few   Assessment/Plan:   1. Encounter for general adult medical  examination with abnormal findings Annual health maintenance exam today  2. Irregular menstrual cycle Review prior labs indicating perimenopause. Will get ultrasound rescheduled and review when results are available.   3. Leiomyoma of body of uterus Will get pelvic ultrasound rescheduled.   4. Iron deficiency anemia, unspecified iron deficiency anemia type - Ferrous Fumarate (HEMOCYTE) 324 (106 Fe) MG TABS tablet; Take 1 tablet (106 mg of iron total) by mouth daily.  Dispense: 30 tablet; Refill: 5  5. Primary generalized (osteo)arthritis - etodolac (LODINE) 500 MG tablet; Take 1 tablet (500 mg total) by mouth daily.  Dispense: 60 tablet; Refill: 3  6. Screening for malignant neoplasm of cervix - Pap IG and HPV (high risk) DNA detection  7. Screening for breast cancer - MM DIGITAL SCREENING BILATERAL; Future  8. Dysuria - UA/M w/rflx Culture, Routine  General Counseling: Maisley verbalizes understanding of the findings of todays visit and agrees with plan of treatment. I have discussed any further diagnostic evaluation that may be needed or ordered today. We also reviewed her medications today. she has been encouraged to call the office with any questions or concerns that should arise related to todays visit.    Counseling:  This patient was seen by Leretha Pol FNP Collaboration with Dr Lavera Guise as a part of collaborative care agreement  Orders Placed This Encounter  Procedures  . Microscopic Examination  . MM DIGITAL SCREENING BILATERAL  . UA/M w/rflx Culture, Routine    Meds ordered this encounter  Medications  . etodolac (LODINE) 500 MG tablet    Sig: Take 1 tablet (500 mg total) by mouth daily.    Dispense:  60 tablet    Refill:  3    Order Specific Question:   Supervising Provider    Answer:   Lavera Guise [[id number]]  . Ferrous Fumarate (HEMOCYTE) 324 (106 Fe) MG TABS tablet    Sig: Take 1 tablet (106 mg of iron total) by mouth daily.    Dispense:  30 tablet     Refill:  5    Order Specific Question:   Supervising Provider    Answer:   Lavera Guise [[id number]]    Time spent: [place], MD  Internal Medicine

## 2018-08-04 DIAGNOSIS — M150 Primary generalized (osteo)arthritis: Secondary | ICD-10-CM | POA: Insufficient documentation

## 2018-08-04 DIAGNOSIS — Z124 Encounter for screening for malignant neoplasm of cervix: Secondary | ICD-10-CM | POA: Insufficient documentation

## 2018-08-04 DIAGNOSIS — Z1239 Encounter for other screening for malignant neoplasm of breast: Secondary | ICD-10-CM | POA: Insufficient documentation

## 2018-08-04 DIAGNOSIS — D509 Iron deficiency anemia, unspecified: Secondary | ICD-10-CM | POA: Insufficient documentation

## 2018-08-04 DIAGNOSIS — Z0001 Encounter for general adult medical examination with abnormal findings: Secondary | ICD-10-CM | POA: Insufficient documentation

## 2018-08-04 LAB — UA/M W/RFLX CULTURE, ROUTINE
BILIRUBIN UA: NEGATIVE
GLUCOSE, UA: NEGATIVE
Ketones, UA: NEGATIVE
LEUKOCYTES UA: NEGATIVE
Nitrite, UA: NEGATIVE
Protein, UA: NEGATIVE
RBC, UA: NEGATIVE
SPEC GRAV UA: 1.02 (ref 1.005–1.030)
Urobilinogen, Ur: 0.2 mg/dL (ref 0.2–1.0)
pH, UA: 6 (ref 5.0–7.5)

## 2018-08-04 LAB — MICROSCOPIC EXAMINATION: Casts: NONE SEEN /lpf

## 2018-08-07 LAB — PAP IG AND HPV HIGH-RISK: HPV, HIGH-RISK: NEGATIVE

## 2018-08-11 ENCOUNTER — Ambulatory Visit: Payer: BLUE CROSS/BLUE SHIELD

## 2018-08-24 ENCOUNTER — Ambulatory Visit
Admission: RE | Admit: 2018-08-24 | Discharge: 2018-08-24 | Disposition: A | Discharge status: Home / Self Care | Payer: BLUE CROSS/BLUE SHIELD | Source: Ambulatory Visit | Attending: Nurse Practitioner | Admitting: Nurse Practitioner

## 2018-08-24 DIAGNOSIS — Z1231 Encounter for screening mammogram for malignant neoplasm of breast: Secondary | ICD-10-CM | POA: Diagnosis not present

## 2018-08-24 DIAGNOSIS — Z1239 Encounter for other screening for malignant neoplasm of breast: Secondary | ICD-10-CM | POA: Insufficient documentation

## 2018-08-25 ENCOUNTER — Other Ambulatory Visit: Payer: Self-pay | Admitting: Nurse Practitioner

## 2018-08-25 DIAGNOSIS — R928 Other abnormal and inconclusive findings on diagnostic imaging of breast: Secondary | ICD-10-CM

## 2018-09-06 ENCOUNTER — Ambulatory Visit
Admission: RE | Admit: 2018-09-06 | Discharge: 2018-09-06 | Disposition: A | Discharge status: Home / Self Care | Payer: BLUE CROSS/BLUE SHIELD | Source: Ambulatory Visit | Attending: Nurse Practitioner | Admitting: Nurse Practitioner

## 2018-09-06 DIAGNOSIS — R928 Other abnormal and inconclusive findings on diagnostic imaging of breast: Secondary | ICD-10-CM | POA: Diagnosis not present

## 2018-09-06 DIAGNOSIS — N6489 Other specified disorders of breast: Secondary | ICD-10-CM | POA: Diagnosis not present

## 2018-09-06 DIAGNOSIS — R922 Inconclusive mammogram: Secondary | ICD-10-CM | POA: Diagnosis not present

## 2018-09-13 ENCOUNTER — Telehealth: Payer: Self-pay

## 2018-09-13 NOTE — Telephone Encounter (Signed)
Please let her know her pap was normal. It is resulted and posted on her MyChart.

## 2018-09-13 NOTE — Telephone Encounter (Signed)
Pt advised pap is normal /np

## 2018-09-22 ENCOUNTER — Other Ambulatory Visit (INDEPENDENT_AMBULATORY_CARE_PROVIDER_SITE_OTHER): Payer: BLUE CROSS/BLUE SHIELD

## 2018-09-22 ENCOUNTER — Encounter: Payer: Self-pay | Admitting: Obstetrics and Gynecology

## 2018-09-22 ENCOUNTER — Ambulatory Visit: Payer: BLUE CROSS/BLUE SHIELD | Admitting: Obstetrics and Gynecology

## 2018-09-22 VITALS — BP 114/71 | HR 84 | Ht 64.0 in | Wt 115.5 lb

## 2018-09-22 DIAGNOSIS — N83209 Unspecified ovarian cyst, unspecified side: Secondary | ICD-10-CM

## 2018-09-22 DIAGNOSIS — N83202 Unspecified ovarian cyst, left side: Secondary | ICD-10-CM | POA: Diagnosis not present

## 2018-09-22 DIAGNOSIS — R102 Pelvic and perineal pain: Secondary | ICD-10-CM | POA: Diagnosis not present

## 2018-09-22 MED ORDER — LEVONORGEST-ETH ESTRAD 91-DAY 0.1-0.02 & 0.01 MG PO TABS: 1.0000 | ORAL_TABLET | Freq: Every day | ORAL | 0 refills | Status: DC

## 2018-09-22 NOTE — Progress Notes (Signed)
  Subjective:     Patient ID: Sydney French, female   DOB: 1971-09-15, 47 y.o.   MRN: [id number]  HPI Here for referral from PCP with concerns about RLQ pain and menstrual irregularities. Reports occasional pains in RLQ. It is tolerable but worries her that something is wrong. Denies any relationship to bladder/bowel habits. Is currently sexually active with condom use, denies any pain with sex. Denies post coital bleeding. Does have random spotting.  Does reports h/o cervical polyps, endometriosis but denies uterine fibroid. Denies any known family history of female cancers.     Onset menarche at age 10, with now monthly menses that are a little heavier.   Review of Systems     Objective:   Physical Exam A&Ox4 Well groomed female in no distress Blood pressure 114/71, pulse 84, height 5\' 4"  (1.626 m), weight 115 lb 8 oz (52.4 kg), last menstrual period 08/30/2018. Abdomen soft and  Pelvic exam: deferred in leu of pelvic ultrasound- Ultrasound today reveals:  Findings:  The uterus is retroverted and measures 9.2x5.1x5.8cm. Echo texture is heterogenous with evidence of focal mass . A hyperechoic area = 2.2x1.4cm, in the posterior uterine wall near the fundus with other evidences of  adenomyosis  (a mottled inhomogeneous myometrial texture, globular appearing uterus, small cystic spaces within the myometrium.) The Endometrium measures 8.4 mm.  Right Ovary measures 6.2 cm3. It is normal in appearance.Surrounded by trace free fluid Left Ovary measures 16.8 cm3. It has two simple cyst = 1.7x1.7cm and 0.7x0.7cm  Survey of the adnexa demonstrates no adnexal masses. There is trace free fluid in the posterior cul de sac. = 1x0.5 cm    Assessment:     Ovarian cyst RLQ pelvic pain    Plan:     Counseled on findings and ovarian cyst. Willing to try low dose OCPs for suppression of ovarian function and hopeful resolution of cysts. RTC in 12 weeks to follow up ultrasound.   Mellina Benison,CNM

## 2018-09-22 NOTE — Patient Instructions (Addendum)
Ovarian Cyst     An ovarian cyst is a fluid-filled sac that forms on an ovary. The ovaries are small organs that produce eggs in women. Various types of cysts can form on the ovaries. Some may cause symptoms and require treatment. Most ovarian cysts go away on their own, are not cancerous (are benign), and do not cause problems. Common types of ovarian cysts include:  Functional (follicle) cysts. ? Occur during the menstrual cycle, and usually go away with the next menstrual cycle if you do not get pregnant. ? Usually cause no symptoms.  Endometriomas. ? Are cysts that form from the tissue that lines the uterus (endometrium). ? Are sometimes called "chocolate cysts" because they become filled with blood that turns brown. ? Can cause pain in the lower abdomen during intercourse and during your period.  Cystadenoma cysts. ? Develop from cells on the outside surface of the ovary. ? Can get very large and cause lower abdomen pain and pain with intercourse. ? Can cause severe pain if they twist or break open (rupture).  Dermoid cysts. ? Are sometimes found in both ovaries. ? May contain different kinds of body tissue, such as skin, teeth, hair, or cartilage. ? Usually do not cause symptoms unless they get very big.  Theca lutein cysts. ? Occur when too much of a certain hormone (human chorionic gonadotropin) is produced and overstimulates the ovaries to produce an egg. ? Are most common after having procedures used to assist with the conception of a baby (in vitro fertilization). What are the causes? Ovarian cysts may be caused by:  Ovarian hyperstimulation syndrome. This is a condition that can develop from taking fertility medicines. It causes multiple large ovarian cysts to form.  Polycystic ovarian syndrome (PCOS). This is a common hormonal disorder that can cause ovarian cysts, as well as problems with your period or fertility. What increases the risk? The following factors may  make you more likely to develop ovarian cysts:  Being overweight or obese.  Taking fertility medicines.  Taking certain forms of hormonal birth control.  Smoking. What are the signs or symptoms? Many ovarian cysts do not cause symptoms. If symptoms are present, they may include:  Pelvic pain or pressure.  Pain in the lower abdomen.  Pain during sex.  Abdominal swelling.  Abnormal menstrual periods.  Increasing pain with menstrual periods. How is this diagnosed? These cysts are commonly found during a routine pelvic exam. You may have tests to find out more about the cyst, such as:  Ultrasound.  X-ray of the pelvis.  CT scan.  MRI.  Blood tests. How is this treated? Many ovarian cysts go away on their own without treatment. Your health care provider may want to check your cyst regularly for 2-3 months to see if it changes. If you are in menopause, it is especially important to have your cyst monitored closely because menopausal women have a higher rate of ovarian cancer. When treatment is needed, it may include:  Medicines to help relieve pain.  A procedure to drain the cyst (aspiration).  Surgery to remove the whole cyst.  Hormone treatment or birth control pills. These methods are sometimes used to help dissolve a cyst. Follow these instructions at home:  Take over-the-counter and prescription medicines only as told by your health care provider.  Do not drive or use heavy machinery while taking prescription pain medicine.  Get regular pelvic exams and Pap tests as often as told by your health care provider.    Return to your normal activities as told by your health care provider. Ask your health care provider what activities are safe for you.  Do not use any products that contain nicotine or tobacco, such as cigarettes and e-cigarettes. If you need help quitting, ask your health care provider.  Keep all follow-up visits as told by your health care provider.  This is important. Contact a health care provider if:  Your periods are late, irregular, or painful, or they stop.  You have pelvic pain that does not go away.  You have pressure on your bladder or trouble emptying your bladder completely.  You have pain during sex.  You have any of the following in your abdomen: ? A feeling of fullness. ? Pressure. ? Discomfort. ? Pain that does not go away. ? Swelling.  You feel generally ill.  You become constipated.  You lose your appetite.  You develop severe acne.  You start to have more body hair and facial hair.  You are gaining weight or losing weight without changing your exercise and eating habits.  You think you may be pregnant. Get help right away if:  You have abdominal pain that is severe or gets worse.  You cannot eat or drink without vomiting.  You suddenly develop a fever.  Your menstrual period is much heavier than usual. This information is not intended to replace advice given to you by your health care provider. Make sure you discuss any questions you have with your health care provider. Document Released: 09/22/2005 Document Revised: 04/11/2016 Document Reviewed: 02/24/2016 Elsevier Interactive Patient Education  2019 [place]

## 2018-09-23 ENCOUNTER — Other Ambulatory Visit: Payer: BLUE CROSS/BLUE SHIELD

## 2018-11-18 ENCOUNTER — Ambulatory Visit: Payer: Self-pay

## 2018-11-18 DIAGNOSIS — Z0000 Encounter for general adult medical examination without abnormal findings: Secondary | ICD-10-CM

## 2018-12-17 ENCOUNTER — Other Ambulatory Visit: Payer: Self-pay | Admitting: Obstetrics and Gynecology

## 2018-12-21 ENCOUNTER — Other Ambulatory Visit: Payer: Self-pay | Admitting: Obstetrics and Gynecology

## 2018-12-21 ENCOUNTER — Telehealth: Payer: Self-pay | Admitting: Obstetrics and Gynecology

## 2018-12-21 DIAGNOSIS — N83209 Unspecified ovarian cyst, unspecified side: Secondary | ICD-10-CM

## 2018-12-21 NOTE — Telephone Encounter (Signed)
The patient states she is having some light bleeding off and on x8 weeks, and she is asking about whether the [place] will be an issue if it is a vaginal [place], and she is asking to speak with Amy, please advise, thanks.

## 2018-12-22 ENCOUNTER — Encounter: Payer: Self-pay | Admitting: Obstetrics and Gynecology

## 2018-12-22 ENCOUNTER — Other Ambulatory Visit: Payer: Self-pay

## 2018-12-22 ENCOUNTER — Ambulatory Visit (INDEPENDENT_AMBULATORY_CARE_PROVIDER_SITE_OTHER): Payer: BLUE CROSS/BLUE SHIELD | Admitting: Obstetrics and Gynecology

## 2018-12-22 ENCOUNTER — Ambulatory Visit (INDEPENDENT_AMBULATORY_CARE_PROVIDER_SITE_OTHER): Payer: BLUE CROSS/BLUE SHIELD

## 2018-12-22 VITALS — BP 113/76 | HR 91 | Wt 117.4 lb

## 2018-12-22 DIAGNOSIS — N83202 Unspecified ovarian cyst, left side: Secondary | ICD-10-CM

## 2018-12-22 DIAGNOSIS — N83209 Unspecified ovarian cyst, unspecified side: Secondary | ICD-10-CM

## 2018-12-22 DIAGNOSIS — D259 Leiomyoma of uterus, unspecified: Secondary | ICD-10-CM | POA: Diagnosis not present

## 2018-12-22 MED ORDER — MEDROXYPROGESTERONE ACETATE 150 MG/ML IM SUSP: 150.0000 mg | INTRAMUSCULAR | 1 refills | Status: DC

## 2018-12-22 NOTE — Telephone Encounter (Signed)
Pt came in for [place]

## 2018-12-22 NOTE — Progress Notes (Signed)
  Subjective:     Patient ID: Sydney French, female   DOB: 01-01-1971, 48 y.o.   MRN: [id number]  HPI Started a menses 8 weeks ago and has seen daily spotting to heavier bleeding. But it seems to be lessening over last few days. Denies any pelvic pain or changes in BM, urination. Not sexually active since bleeding started 8 weeks ago.   Please review u/s in chart  Review of Systems  All other systems reviewed and are negative.      Objective:   Physical Exam A&Ox4 Well groomed female in no distress Blood pressure 113/76, pulse 91, weight 117 lb 6.4 oz (53.3 kg). PE not indicated    Assessment:     BTB on OCPs Left ovarian cyst-persist    Plan:     Will stop OCPs for the next 5 days, and then start depo. Prescription sent and will return in 5 days to get injection. Will follow up as needed.   Melody Shambley,CNM

## 2018-12-27 ENCOUNTER — Ambulatory Visit (INDEPENDENT_AMBULATORY_CARE_PROVIDER_SITE_OTHER): Payer: BLUE CROSS/BLUE SHIELD | Admitting: Certified Nurse Midwife

## 2018-12-27 ENCOUNTER — Other Ambulatory Visit: Payer: Self-pay

## 2018-12-27 VITALS — BP 110/71 | HR 83 | Ht 64.0 in | Wt 117.1 lb

## 2018-12-27 DIAGNOSIS — Z3042 Encounter for surveillance of injectable contraceptive: Secondary | ICD-10-CM | POA: Diagnosis not present

## 2018-12-27 DIAGNOSIS — Z3202 Encounter for pregnancy test, result negative: Secondary | ICD-10-CM

## 2018-12-27 LAB — POCT URINE PREGNANCY: Preg Test, Ur: NEGATIVE

## 2018-12-27 MED ORDER — MEDROXYPROGESTERONE ACETATE 150 MG/ML IM SUSP
150.0000 mg | Freq: Once | INTRAMUSCULAR | Status: AC
  Administered 2018-12-27: 150 mg via INTRAMUSCULAR

## 2018-12-27 NOTE — Progress Notes (Signed)
Last depo inj: initial inj UPT: neg Side effects:initial Next Depo- Provera injection due: 6/8-6/22/20 Annual exam due:

## 2019-01-11 ENCOUNTER — Other Ambulatory Visit: Payer: Self-pay

## 2019-01-11 ENCOUNTER — Ambulatory Visit: Payer: Self-pay | Admitting: Adult Health

## 2019-01-11 ENCOUNTER — Telehealth: Payer: Self-pay | Admitting: Nurse Practitioner

## 2019-01-11 DIAGNOSIS — N390 Urinary tract infection, site not specified: Secondary | ICD-10-CM

## 2019-01-11 MED ORDER — SULFAMETHOXAZOLE-TRIMETHOPRIM 800-160 MG PO TABS: 1.0000 | ORAL_TABLET | Freq: Two times a day (BID) | ORAL | 0 refills | Status: DC

## 2019-01-11 NOTE — Progress Notes (Signed)
   Subjective:    Patient ID: Sydney French, female    DOB: 06-08-1971, 48 y.o.   MRN: [id number]  HPI Telephonic visit with Sydney French today with reports of urinary symptoms for about "1 week" that has worsened. She reports urinary frequency, urgency, lower adbominal pain, dysuria, and a stronger smelling urine. Denies urine being darker in color/cloudy, flank pain or fever.  Hasn't tried anyting over the counter but is drinking plenty of fluids.  Hx of UTI  Takes depo for contraception and had 12/2018  Meds, problem list and allergies discussed and reviewed today.       Review of Systems  Constitutional: Negative for fever.  Genitourinary: Positive for dysuria, frequency and urgency. Negative for flank pain and hematuria.  Musculoskeletal: Negative for back pain.       Objective:   Physical Exam        Assessment & Plan:

## 2019-01-11 NOTE — Patient Instructions (Addendum)
Hi Sydney French, it was great speaking with you today! Please increase your fluid intake and take meds as directed If no improvement in symptoms in the next 48-72 hours please consider coming in for an office visit to have your urine checked   Urinary Tract Infection, Adult  A urinary tract infection (UTI) is an infection of any part of the urinary tract. The urinary tract includes:  The kidneys.  The ureters.  The bladder.  The urethra. These organs make, store, and get rid of pee (urine) in the body. What are the causes? This is caused by germs (bacteria) in your genital area. These germs grow and cause swelling (inflammation) of your urinary tract. What increases the risk? You are more likely to develop this condition if:  You have a small, thin tube (catheter) to drain pee.  You cannot control when you pee or poop (incontinence).  You are female, and: ? You use these methods to prevent pregnancy: ? A medicine that kills sperm (spermicide). ? A device that blocks sperm (diaphragm). ? You have low levels of a female hormone (estrogen). ? You are pregnant.  You have genes that add to your risk.  You are sexually active.  You take antibiotic medicines.  You have trouble peeing because of: ? A prostate that is bigger than normal, if you are female. ? A blockage in the part of your body that drains pee from the bladder (urethra). ? A kidney stone. ? A nerve condition that affects your bladder (neurogenic bladder). ? Not getting enough to drink. ? Not peeing often enough.  You have other conditions, such as: ? Diabetes. ? A weak disease-fighting system (immune system). ? Sickle cell disease. ? Gout. ? Injury of the spine. What are the signs or symptoms? Symptoms of this condition include:  Needing to pee right away (urgently).  Peeing often.  Peeing small amounts often.  Pain or burning when peeing.  Blood in the pee.  Pee that smells bad or not like  normal.  Trouble peeing.  Pee that is cloudy.  Fluid coming from the vagina, if you are female.  Pain in the belly or lower back. Other symptoms include:  Throwing up (vomiting).  No urge to eat.  Feeling mixed up (confused).  Being tired and grouchy (irritable).  A fever.  Watery poop (diarrhea). How is this treated? This condition may be treated with:  Antibiotic medicine.  Other medicines.  Drinking enough water. Follow these instructions at home:   Medicines  Take over-the-counter and prescription medicines only as told by your doctor.  If you were prescribed an antibiotic medicine, take it as told by your doctor. Do not stop taking it even if you start to feel better. General instructions  Make sure you: ? Pee until your bladder is empty. ? Do not hold pee for a long time. ? Empty your bladder after sex. ? Wipe from front to back after pooping if you are a female. Use each tissue one time when you wipe.  Drink enough fluid to keep your pee pale yellow.  Keep all follow-up visits as told by your doctor. This is important. Contact a doctor if:  You do not get better after 1-2 days.  Your symptoms go away and then come back. Get help right away if:  You have very bad back pain.  You have very bad pain in your lower belly.  You have a fever.  You are sick to your stomach (nauseous).  You  are throwing up. Summary  A urinary tract infection (UTI) is an infection of any part of the urinary tract.  This condition is caused by germs in your genital area.  There are many risk factors for a UTI. These include having a small, thin tube to drain pee and not being able to control when you pee or poop.  Treatment includes antibiotic medicines for germs.  Drink enough fluid to keep your pee pale yellow. This information is not intended to replace advice given to you by your health care provider. Make sure you discuss any questions you have with your  health care provider. Document Released: 03/10/2008 Document Revised: 04/01/2018 Document Reviewed: 04/01/2018 Elsevier Interactive Patient Education  2019 [employer].

## 2019-01-13 ENCOUNTER — Ambulatory Visit: Payer: Self-pay | Admitting: Nurse Practitioner

## 2019-03-16 ENCOUNTER — Other Ambulatory Visit: Payer: Self-pay | Admitting: Obstetrics and Gynecology

## 2019-06-01 ENCOUNTER — Ambulatory Visit: Payer: BLUE CROSS/BLUE SHIELD | Admitting: Obstetrics and Gynecology

## 2019-06-01 ENCOUNTER — Other Ambulatory Visit: Payer: Self-pay

## 2019-06-01 ENCOUNTER — Encounter: Payer: Self-pay | Admitting: Obstetrics and Gynecology

## 2019-06-01 VITALS — BP 99/53 | HR 87 | Ht 64.0 in | Wt 115.9 lb

## 2019-06-01 DIAGNOSIS — R1031 Right lower quadrant pain: Secondary | ICD-10-CM

## 2019-06-01 DIAGNOSIS — N926 Irregular menstruation, unspecified: Secondary | ICD-10-CM | POA: Diagnosis not present

## 2019-06-01 NOTE — Progress Notes (Signed)
  Subjective:     Patient ID: Sydney French, female   DOB: Dec 26, 1970, 48 y.o.   MRN: [id number]  HPI Reports more frequent RLQ pain and pain with BMs. Also started spotting 6 days ago. Last depo dose for DUB was in March. Didn't think she needed it anymore as she thought she was in menopause. Has not been sexually active with female spouse in almost a year due to life/home stressors.   Reports BM are more frequent and loose. Has changed diet to increase more fruits and veggies. Denies fever or nausea/vomiting or urinary symptoms. Notices increased gas pains. Was told she might IBS in the past.   Review of Systems  Gastrointestinal: Positive for abdominal pain.  Genitourinary: Positive for pelvic pain.  All other systems reviewed and are negative.      Objective:   Physical Exam A&Ox4 Well groomed female in no distress Blood pressure (!) 99/53, pulse 87, height '5\' 4"'$  (1.626 m), weight 115 lb 14.4 oz (52.6 kg), last menstrual period 05/26/2019.  Body mass index is 19.89 kg/m.  HRR Lungs clear Abdomen soft with slight tenderness in RLQ and increased bowel sounds in all four quadrants.   Pelvic exam: normal external genitalia, vulva, vagina, cervix, uterus and adnexa.    Assessment:     RLA pain secondary to gas Peri-menopausal menstrual pattern    Plan:     Discussed findings and recommend expectant management at this time. My try OTC gas X if desires.  Will watch menses for now and discussed menopause occurring when there has been one year without menses not on hormones.  States clear understanding. Will follow up with GI if symptoms don't resolve or improve.   Ruthvik Barnaby,CNM

## 2019-06-21 DIAGNOSIS — L720 Epidermal cyst: Secondary | ICD-10-CM | POA: Diagnosis not present

## 2019-06-21 DIAGNOSIS — L700 Acne vulgaris: Secondary | ICD-10-CM | POA: Diagnosis not present

## 2019-06-21 DIAGNOSIS — L821 Other seborrheic keratosis: Secondary | ICD-10-CM | POA: Diagnosis not present

## 2019-06-21 DIAGNOSIS — L738 Other specified follicular disorders: Secondary | ICD-10-CM | POA: Diagnosis not present

## 2019-06-21 DIAGNOSIS — D485 Neoplasm of uncertain behavior of skin: Secondary | ICD-10-CM | POA: Diagnosis not present

## 2019-06-21 DIAGNOSIS — D225 Melanocytic nevi of trunk: Secondary | ICD-10-CM | POA: Diagnosis not present

## 2019-07-04 ENCOUNTER — Ambulatory Visit: Payer: BLUE CROSS/BLUE SHIELD | Admitting: Nurse Practitioner

## 2019-07-05 ENCOUNTER — Ambulatory Visit: Payer: BC Managed Care – PPO | Admitting: Nurse Practitioner

## 2019-07-05 ENCOUNTER — Other Ambulatory Visit: Payer: Self-pay

## 2019-07-05 ENCOUNTER — Encounter: Payer: Self-pay | Admitting: Nurse Practitioner

## 2019-07-05 VITALS — BP 120/78 | HR 98 | Temp 97.5°F | Resp 16 | Ht 64.0 in | Wt 117.4 lb

## 2019-07-05 DIAGNOSIS — R10827 Generalized rebound abdominal tenderness: Secondary | ICD-10-CM | POA: Insufficient documentation

## 2019-07-05 NOTE — Progress Notes (Signed)
[facility] [place], [place] [address]  Internal MEDICINE  Office Visit Note  Patient Name: Sydney French  [id number]  [id number]  Date of Service: 07/05/2019  Chief Complaint  Patient presents with  . Abdominal Pain    uncomfortable pain, upper right quad  . Quality Metric Gaps    pt is due for a colonoscopy     The patient is here for acute visit. Today, she is complaining about abdominal pain.Marland Kitchen this is mostly right upper quadrant of the abdomen. No association with eating. She does not eat much greasy or high fat foods. Had gallbladder removed many years ago. She describes this more as a discomfort. More when she is sitting. Will have to get up, walk around and stretch out. She denies nausea, vomiting, diarrhea, or constipation. She denies fever or fatigue. Has seen her GYN provider, thinking issue may be related to fibroids or ovarian cysts.  GYN does not believe there are any issues with reproductive system.   Pt is here for a sick visit.     Current Medication:  Outpatient Encounter Medications as of 07/05/2019  Medication Sig  . diphenhydrAMINE HCl (BENADRYL ALLERGY PO) Take by mouth.  . Ferrous Fumarate (HEMOCYTE) 324 (106 Fe) MG TABS tablet Take 1 tablet (106 mg of iron total) by mouth daily.  Marland Kitchen ibuprofen (ADVIL) 200 MG tablet Take 200 mg by mouth every 6 (six) hours as needed.  . [DISCONTINUED] medroxyPROGESTERone (DEPO-PROVERA) 150 MG/ML injection Inject 1 mL (150 mg total) into the muscle every 3 (three) months. (Patient not taking: Reported on 06/01/2019)  . [DISCONTINUED] MUCINEX FAST-MAX 5-10-200-325 MG CAPS Take by mouth.  . [DISCONTINUED] sulfamethoxazole-trimethoprim (BACTRIM DS,SEPTRA DS) 800-160 MG tablet Take 1 tablet by mouth 2 (two) times daily. (Patient not taking: Reported on 06/01/2019)   No facility-administered encounter medications on file as of 07/05/2019.       Medical History: Past Medical History:  Diagnosis Date   . Endometriosis   . History of ovarian cyst   . Irregular periods   . Osteoarthritis      Today's Vitals   07/05/19 1029  BP: 120/78  Pulse: 98  Resp: 16  Temp: (!) 97.5 F (36.4 C)  SpO2: 99%  Weight: 117 lb 6.4 oz (53.3 kg)  Height: 5\' 4"  (1.626 m)   Body mass index is 20.15 kg/m.  Review of Systems  Constitutional: Negative for chills, fatigue and unexpected weight change.  HENT: Negative for congestion, postnasal drip, rhinorrhea, sneezing and sore throat.   Eyes: Negative for redness.  Respiratory: Negative for cough, chest tightness and shortness of breath.   Cardiovascular: Negative for chest pain and palpitations.  Gastrointestinal: Negative for abdominal pain, constipation, diarrhea, nausea and vomiting.       Abdominal discomfort, mostly in the right upper quadrant of the abdomen and when she is sitting for long periods of time. Feels like something is being "crunched."  Endocrine: Negative for cold intolerance, heat intolerance, polydipsia and polyuria.  Musculoskeletal: Negative for arthralgias, back pain, joint swelling and neck pain.  Skin: Negative for rash.  Neurological: Negative for dizziness, tremors, numbness and headaches.  Hematological: Negative for adenopathy. Does not bruise/bleed easily.  Psychiatric/Behavioral: Negative for behavioral problems (Depression), sleep disturbance and suicidal ideas. The patient is not nervous/anxious.     Physical Exam Vitals signs and nursing note reviewed.  Constitutional:      General: She is not in acute distress.    Appearance: She is well-developed.  She is not diaphoretic.  HENT:     Head: Normocephalic and atraumatic.     Mouth/Throat:     Pharynx: No oropharyngeal exudate.  Eyes:     Pupils: Pupils are equal, round, and reactive to light.  Neck:     Musculoskeletal: Normal range of motion and neck supple.     Thyroid: No thyromegaly.     Vascular: No JVD.     Trachea: No tracheal deviation.   Cardiovascular:     Rate and Rhythm: Normal rate and regular rhythm.     Heart sounds: Normal heart sounds. No murmur. No friction rub. No gallop.   Pulmonary:     Effort: Pulmonary effort is normal. No respiratory distress.     Breath sounds: Normal breath sounds. No wheezing or rales.  Chest:     Chest wall: No tenderness.  Abdominal:     General: Abdomen is flat. Bowel sounds are decreased.     Palpations: Abdomen is soft. There is no hepatomegaly, splenomegaly or mass.     Tenderness: There is abdominal tenderness in the right lower quadrant and suprapubic area. There is no guarding or rebound. Negative signs include McBurney's sign.     Hernia: No hernia is present.  Musculoskeletal: Normal range of motion.  Lymphadenopathy:     Cervical: No cervical adenopathy.  Skin:    General: Skin is warm and dry.  Neurological:     Mental Status: She is alert and oriented to person, place, and time.     Cranial Nerves: No cranial nerve deficit.  Psychiatric:        Behavior: Behavior normal.        Thought Content: Thought content normal.        Judgment: Judgment normal.   Assessment/Plan: 1. Generalized abdominal tenderness with rebound tenderness Unclear etiology. Will get complete abdominal ultrasound. Will pay particular attention to liver and appendix. If no abnormalities, will consider abdominal CT for further evaluation.  - US Abdomen Complete; Future  General Counseling: truby kilson understanding of the findings of todays visit and agrees with plan of treatment. I have discussed any further diagnostic evaluation that may be needed or ordered today. We also reviewed her medications today. she has been encouraged to call the office with any questions or concerns that should arise related to todays visit.    Counseling:  This patient was seen by Leretha Pol FNP Collaboration with Dr Lavera Guise as a part of collaborative care agreement  Orders Placed This Encounter   Procedures  . US Abdomen Complete      Time spent: 15 Minutes

## 2019-07-08 DIAGNOSIS — I87393 Chronic venous hypertension (idiopathic) with other complications of bilateral lower extremity: Secondary | ICD-10-CM | POA: Diagnosis not present

## 2019-07-15 ENCOUNTER — Other Ambulatory Visit: Payer: BC Managed Care – PPO

## 2019-07-22 ENCOUNTER — Ambulatory Visit: Payer: BC Managed Care – PPO | Admitting: Nurse Practitioner

## 2019-07-27 ENCOUNTER — Other Ambulatory Visit: Payer: Self-pay

## 2019-07-27 ENCOUNTER — Ambulatory Visit: Payer: Self-pay

## 2019-07-27 DIAGNOSIS — Z23 Encounter for immunization: Secondary | ICD-10-CM

## 2019-07-29 ENCOUNTER — Ambulatory Visit (INDEPENDENT_AMBULATORY_CARE_PROVIDER_SITE_OTHER): Payer: BC Managed Care – PPO

## 2019-07-29 ENCOUNTER — Other Ambulatory Visit: Payer: Self-pay

## 2019-07-29 DIAGNOSIS — R10827 Generalized rebound abdominal tenderness: Secondary | ICD-10-CM | POA: Diagnosis not present

## 2019-08-04 ENCOUNTER — Telehealth: Payer: Self-pay | Admitting: Nurse Practitioner

## 2019-08-04 NOTE — Telephone Encounter (Signed)
Please let the patient know that her ultrasound showed very mld fatty infiltration of liver. No other abnormalities noted. thanks

## 2019-08-04 NOTE — Telephone Encounter (Signed)
Patient called asking if she could have the results from her ultrasound from last Friday, patient has upcoming appt on Monday

## 2019-08-04 NOTE — Progress Notes (Signed)
Very mld fatty infiltration of liver. No other abnormalities noted.

## 2019-08-04 NOTE — Telephone Encounter (Signed)
Patient advised and will discuss further with Heather at follow up on monday

## 2019-08-08 ENCOUNTER — Encounter: Payer: Self-pay | Admitting: Nurse Practitioner

## 2019-08-08 ENCOUNTER — Ambulatory Visit (INDEPENDENT_AMBULATORY_CARE_PROVIDER_SITE_OTHER): Payer: BC Managed Care – PPO | Admitting: Nurse Practitioner

## 2019-08-08 ENCOUNTER — Other Ambulatory Visit: Payer: Self-pay

## 2019-08-08 VITALS — BP 109/73 | HR 94 | Temp 97.5°F | Resp 16 | Ht 64.0 in | Wt 118.6 lb

## 2019-08-08 DIAGNOSIS — Z0001 Encounter for general adult medical examination with abnormal findings: Secondary | ICD-10-CM

## 2019-08-08 DIAGNOSIS — M150 Primary generalized (osteo)arthritis: Secondary | ICD-10-CM | POA: Diagnosis not present

## 2019-08-08 DIAGNOSIS — L659 Nonscarring hair loss, unspecified: Secondary | ICD-10-CM | POA: Insufficient documentation

## 2019-08-08 DIAGNOSIS — Z124 Encounter for screening for malignant neoplasm of cervix: Secondary | ICD-10-CM

## 2019-08-08 DIAGNOSIS — Z1231 Encounter for screening mammogram for malignant neoplasm of breast: Secondary | ICD-10-CM

## 2019-08-08 DIAGNOSIS — R10817 Generalized abdominal tenderness: Secondary | ICD-10-CM

## 2019-08-08 DIAGNOSIS — Z1211 Encounter for screening for malignant neoplasm of colon: Secondary | ICD-10-CM

## 2019-08-08 DIAGNOSIS — R300 Dysuria: Secondary | ICD-10-CM | POA: Diagnosis not present

## 2019-08-08 MED ORDER — ETODOLAC 500 MG PO TABS: 500.0000 mg | ORAL_TABLET | Freq: Two times a day (BID) | ORAL | 2 refills | Status: DC

## 2019-08-08 NOTE — Progress Notes (Signed)
[facility] [place], [place] [address]  Internal MEDICINE  Office Visit Note  Patient Name: Sydney French  [id number]  [id number]  Date of Service: 08/09/2019   Pt is here for routine health maintenance examination    Chief Complaint  Patient presents with  . Annual Exam  . Arthritis  . Quality Metric Gaps    colonoscopy  . Abdominal Pain    on right side   . Alopecia  . Results    ultrasound     The patient is here for health maintenance exam today. She continues to complain about abdominal pain.Marland Kitchen this is mostly right upper quadrant of the abdomen. No association with eating. She does not eat much greasy or high fat foods. Had gallbladder removed many years ago. She describes this more as a discomfort. More when she is sitting. Will have to get up, walk around and stretch out. She denies nausea, vomiting, diarrhea, or constipation. She denies fever or fatigue. She states that this has improved since her last visit. Seems that she was having some constipation at the time and this has gotten better. She did have ultrasound of the abdomen since she was seen. Showed mild, fatty infiltration of the liver. It was otherwise normal. She is due to have screening colonoscopy as well as routine, fasting labs.   Current Medication: Outpatient Encounter Medications as of 08/08/2019  Medication Sig  . diphenhydrAMINE HCl (BENADRYL ALLERGY PO) Take by mouth.  . etodolac (LODINE) 500 MG tablet Take 1 tablet (500 mg total) by mouth 2 (two) times daily.  . Ferrous Fumarate (HEMOCYTE) 324 (106 Fe) MG TABS tablet Take 1 tablet (106 mg of iron total) by mouth daily.  Marland Kitchen ibuprofen (ADVIL) 200 MG tablet Take 200 mg by mouth every 6 (six) hours as needed.  . [DISCONTINUED] etodolac (LODINE) 500 MG tablet Take 500 mg by mouth 2 (two) times daily.   No facility-administered encounter medications on file as of 08/08/2019.     Surgical History: Past Surgical History:   Procedure Laterality Date  . CESAREAN SECTION    . child birth natural     1995, 1998, 2003  . CHOLECYSTECTOMY  1993  . DILATION AND CURETTAGE OF UTERUS    . GALLBLADDER SURGERY      Medical History: Past Medical History:  Diagnosis Date  . Endometriosis   . History of ovarian cyst   . Irregular periods   . Osteoarthritis     Family History: Family History  Problem Relation Age of Onset  . Asthma Neg Hx   . Breast cancer Neg Hx   . Stroke Neg Hx   . Glaucoma Neg Hx   . Arthritis Neg Hx       Review of Systems  Constitutional: Negative for activity change, chills, fatigue and unexpected weight change.  HENT: Negative for congestion, postnasal drip, rhinorrhea, sneezing and sore throat.   Respiratory: Negative for cough, chest tightness, shortness of breath and wheezing.   Cardiovascular: Negative for chest pain and palpitations.  Gastrointestinal: Negative for abdominal pain, constipation, diarrhea, nausea and vomiting.       Abdominal discomfort, mostly in the right upper quadrant of the abdomen and when she is sitting for long periods of time. Feels like something is being "crunched." has improved since her last visit.   Endocrine: Negative for cold intolerance, heat intolerance, polydipsia and polyuria.  Genitourinary: Negative for flank pain, frequency, hematuria, urgency, vaginal bleeding and vaginal discharge.  Musculoskeletal:  Negative for arthralgias, back pain, joint swelling and neck pain.  Skin: Negative for rash.  Allergic/Immunologic: Negative for environmental allergies.  Neurological: Negative for dizziness, tremors, numbness and headaches.  Hematological: Negative for adenopathy. Does not bruise/bleed easily.  Psychiatric/Behavioral: Negative for behavioral problems (Depression), sleep disturbance and suicidal ideas. The patient is not nervous/anxious.      Today's Vitals   08/08/19 0933  BP: 109/73  Pulse: 94  Resp: 16  Temp: (!) 97.5 F (36.4  C)  SpO2: 99%  Weight: 118 lb 9.6 oz (53.8 kg)  Height: 5\' 4"  (1.626 m)   Body mass index is 20.36 kg/m.   Physical Exam Vitals signs and nursing note reviewed.  Constitutional:      General: She is not in acute distress.    Appearance: She is well-developed. She is not diaphoretic.  HENT:     Head: Normocephalic and atraumatic.     Mouth/Throat:     Pharynx: No oropharyngeal exudate.  Eyes:     Pupils: Pupils are equal, round, and reactive to light.  Neck:     Musculoskeletal: Normal range of motion and neck supple.     Thyroid: No thyromegaly.     Vascular: No JVD.     Trachea: No tracheal deviation.  Cardiovascular:     Rate and Rhythm: Normal rate and regular rhythm.     Pulses: Normal pulses.     Heart sounds: Normal heart sounds. No murmur. No friction rub. No gallop.   Pulmonary:     Effort: Pulmonary effort is normal. No respiratory distress.     Breath sounds: Normal breath sounds. No wheezing or rales.  Chest:     Chest wall: No tenderness.     Breasts:        Right: Normal. No swelling, bleeding, inverted nipple, mass, nipple discharge, skin change or tenderness.        Left: Normal. No swelling, bleeding, inverted nipple, mass, nipple discharge, skin change or tenderness.  Abdominal:     General: Abdomen is flat. Bowel sounds are decreased.     Palpations: Abdomen is soft. There is no hepatomegaly, splenomegaly or mass.     Tenderness: There is abdominal tenderness in the right lower quadrant and suprapubic area. There is no guarding or rebound. Negative signs include McBurney's sign.     Hernia: No hernia is present. There is no hernia in the left inguinal area or right inguinal area.  Genitourinary:    General: Normal vulva.     Exam position: Supine.     Labia:        Right: No tenderness or lesion.        Left: No tenderness or lesion.      Vagina: No vaginal discharge, erythema or tenderness.     Cervix: No cervical motion tenderness, discharge,  friability or erythema.     Uterus: Normal.      Adnexa: Right adnexa normal.     Comments: Slight tenderness of right lower quadrant of the abdomen during bimanual exam. Otherwise not organomegaly present during bimanual exam . Musculoskeletal: Normal range of motion.  Lymphadenopathy:     Cervical: No cervical adenopathy.     Upper Body:     Right upper body: No supraclavicular or axillary adenopathy.     Left upper body: No supraclavicular or axillary adenopathy.     Lower Body: No right inguinal adenopathy. No left inguinal adenopathy.  Skin:    General: Skin is warm and dry.  Neurological:     Mental Status: She is alert and oriented to person, place, and time.     Cranial Nerves: No cranial nerve deficit.  Psychiatric:        Behavior: Behavior normal.        Thought Content: Thought content normal.        Judgment: Judgment normal.   Assessment/Plan: 1. Encounter for general adult medical examination with abnormal findings Annual health maintenance exam with pap smear today.   2. Generalized abdominal tenderness without rebound tenderness Reviewed abdominal ultrasound showing mild fatty infiltration of the liver.  Has improved since last visit. Will monitor. Labs to be checked.   3. Alopecia Check thyroid panel   4. Primary generalized (osteo)arthritis May take lodine as needed and as precribed  - etodolac (LODINE) 500 MG tablet; Take 1 tablet (500 mg total) by mouth 2 (two) times daily.  Dispense: 45 tablet; Refill: 2  5. Routine cervical smear - Pap IG and HPV (high risk) DNA detection  6. Screening for colon cancer Refer to GI for further evaluation.  - Ambulatory referral to Gastroenterology  7. Encounter for screening mammogram for malignant neoplasm of breast - MM DIGITAL SCREENING BILATERAL; Future  8. Dysuria - UA/M w/rflx Culture, Routine  General Counseling: Deniese verbalizes understanding of the findings of todays visit and agrees with plan of  treatment. I have discussed any further diagnostic evaluation that may be needed or ordered today. We also reviewed her medications today. she has been encouraged to call the office with any questions or concerns that should arise related to todays visit.    Counseling:   This patient was seen by Leretha Pol FNP Collaboration with Dr Lavera Guise as a part of collaborative care agreement  Orders Placed This Encounter  Procedures  . Microscopic Examination  . MM DIGITAL SCREENING BILATERAL  . UA/M w/rflx Culture, Routine  . Ambulatory referral to Gastroenterology    Meds ordered this encounter  Medications  . etodolac (LODINE) 500 MG tablet    Sig: Take 1 tablet (500 mg total) by mouth 2 (two) times daily.    Dispense:  45 tablet    Refill:  2    Order Specific Question:   Supervising Provider    Answer:   Lavera Guise [id number]    Time spent:45 [place], MD  Internal Medicine

## 2019-08-09 LAB — MICROSCOPIC EXAMINATION: Casts: NONE SEEN /lpf

## 2019-08-09 LAB — UA/M W/RFLX CULTURE, ROUTINE
Bilirubin, UA: NEGATIVE
Glucose, UA: NEGATIVE
Ketones, UA: NEGATIVE
Leukocytes,UA: NEGATIVE
Nitrite, UA: NEGATIVE
Protein,UA: NEGATIVE
RBC, UA: NEGATIVE
Specific Gravity, UA: 1.019 (ref 1.005–1.030)
Urobilinogen, Ur: 0.2 mg/dL (ref 0.2–1.0)
pH, UA: 5.5 (ref 5.0–7.5)

## 2019-08-12 ENCOUNTER — Other Ambulatory Visit: Payer: Self-pay | Admitting: Nurse Practitioner

## 2019-08-12 DIAGNOSIS — E559 Vitamin D deficiency, unspecified: Secondary | ICD-10-CM | POA: Diagnosis not present

## 2019-08-12 DIAGNOSIS — R10827 Generalized rebound abdominal tenderness: Secondary | ICD-10-CM | POA: Diagnosis not present

## 2019-08-12 DIAGNOSIS — Z0001 Encounter for general adult medical examination with abnormal findings: Secondary | ICD-10-CM | POA: Diagnosis not present

## 2019-08-12 DIAGNOSIS — L659 Nonscarring hair loss, unspecified: Secondary | ICD-10-CM | POA: Diagnosis not present

## 2019-08-13 LAB — COMPREHENSIVE METABOLIC PANEL
ALT: 20 IU/L (ref 0–32)
AST: 20 IU/L (ref 0–40)
Albumin/Globulin Ratio: 1.3 (ref 1.2–2.2)
Albumin: 4.4 g/dL (ref 3.8–4.8)
Alkaline Phosphatase: 69 IU/L (ref 39–117)
BUN/Creatinine Ratio: 14 (ref 9–23)
BUN: 9 mg/dL (ref 6–24)
Bilirubin Total: 0.5 mg/dL (ref 0.0–1.2)
CO2: 25 mmol/L (ref 20–29)
Calcium: 9.5 mg/dL (ref 8.7–10.2)
Chloride: 100 mmol/L (ref 96–106)
Creatinine, Ser: 0.66 mg/dL (ref 0.57–1.00)
GFR calc Af Amer: 119 mL/min/{1.73_m2} (ref >59)
GFR calc non Af Amer: 103 mL/min/{1.73_m2} (ref >59)
Globulin, Total: 3.4 g/dL (ref 1.5–4.5)
Glucose: 92 mg/dL (ref 65–99)
Potassium: 4.1 mmol/L (ref 3.5–5.2)
Sodium: 140 mmol/L (ref 134–144)
Total Protein: 7.8 g/dL (ref 6.0–8.5)

## 2019-08-13 LAB — LIPID PANEL W/O CHOL/HDL RATIO
Cholesterol, Total: 187 mg/dL (ref 100–199)
HDL: 63 mg/dL (ref >39)
LDL Chol Calc (NIH): 112 mg/dL — ABNORMAL HIGH (ref 0–99)
Triglycerides: 67 mg/dL (ref 0–149)
VLDL Cholesterol Cal: 12 mg/dL (ref 5–40)

## 2019-08-13 LAB — CBC
Hematocrit: 41.1 % (ref 34.0–46.6)
Hemoglobin: 13.5 g/dL (ref 11.1–15.9)
MCH: 30.5 pg (ref 26.6–33.0)
MCHC: 32.8 g/dL (ref 31.5–35.7)
MCV: 93 fL (ref 79–97)
Platelets: 268 10*3/uL (ref 150–450)
RBC: 4.42 x10E6/uL (ref 3.77–5.28)
RDW: 12.6 % (ref 11.7–15.4)
WBC: 5.6 10*3/uL (ref 3.4–10.8)

## 2019-08-13 LAB — TSH: TSH: 0.498 u[IU]/mL (ref 0.450–4.500)

## 2019-08-13 LAB — VITAMIN D 25 HYDROXY (VIT D DEFICIENCY, FRACTURES): Vit D, 25-Hydroxy: 18.5 ng/mL — ABNORMAL LOW (ref 30.0–100.0)

## 2019-08-13 LAB — T4, FREE: Free T4: 1.25 ng/dL (ref 0.82–1.77)

## 2019-08-14 LAB — PAP IG AND HPV HIGH-RISK

## 2019-08-14 LAB — HPV, LOW VOLUME (REFLEX): HPV low volume reflex: NEGATIVE

## 2019-08-15 ENCOUNTER — Telehealth: Payer: Self-pay | Admitting: Internal Medicine

## 2019-08-15 ENCOUNTER — Telehealth: Payer: Self-pay

## 2019-08-15 NOTE — Telephone Encounter (Signed)
Returned patients call to schedule her colonoscopy.  LVM for her to call the office back to schedule.  Thanks [employer]

## 2019-08-15 NOTE — Telephone Encounter (Signed)
-----   Message from Ronnell Freshwater, NP sent at 08/15/2019  8:06 AM EST ----- Please et the patient know that her pap smear was normal. Thanks.

## 2019-08-15 NOTE — Telephone Encounter (Signed)
Pt notified of normal pap

## 2019-08-15 NOTE — Progress Notes (Signed)
Please et the patient know that her pap smear was normal. Thanks.

## 2019-08-17 ENCOUNTER — Other Ambulatory Visit: Payer: Self-pay | Admitting: Nurse Practitioner

## 2019-08-17 DIAGNOSIS — E559 Vitamin D deficiency, unspecified: Secondary | ICD-10-CM

## 2019-08-17 MED ORDER — ERGOCALCIFEROL 1.25 MG (50000 UT) PO CAPS: 50000.0000 [IU] | ORAL_CAPSULE | ORAL | 5 refills | Status: DC

## 2019-08-17 NOTE — Progress Notes (Signed)
Vitamin d deficiency noted on labs. Added drisdol 50000iu weekly for next few months. Sent to [facility]. Other labs ok.

## 2019-08-17 NOTE — Progress Notes (Signed)
Hey. Can you let the patient know Vitamin d deficiency noted on labs. Added drisdol 50000iu weekly for next few months. Sent to [facility]. Other labs ok. Thanks

## 2019-08-18 ENCOUNTER — Encounter: Payer: Self-pay | Admitting: *Deleted

## 2019-08-18 ENCOUNTER — Telehealth: Payer: Self-pay

## 2019-08-18 NOTE — Telephone Encounter (Signed)
Lmov for patient to return call in regards to labs.

## 2019-09-05 ENCOUNTER — Ambulatory Visit
Admission: RE | Admit: 2019-09-05 | Discharge: 2019-09-05 | Disposition: A | Discharge status: Home / Self Care | Payer: BC Managed Care – PPO | Source: Ambulatory Visit | Attending: Nurse Practitioner | Admitting: Nurse Practitioner

## 2019-09-05 ENCOUNTER — Inpatient Hospital Stay: Admission: RE | Admit: 2019-09-05 | Payer: BLUE CROSS/BLUE SHIELD | Source: Ambulatory Visit

## 2019-09-05 DIAGNOSIS — Z1231 Encounter for screening mammogram for malignant neoplasm of breast: Secondary | ICD-10-CM

## 2019-09-08 NOTE — Progress Notes (Signed)
Negative 3d mammogram.

## 2019-10-11 ENCOUNTER — Telehealth: Payer: Self-pay

## 2019-10-11 ENCOUNTER — Other Ambulatory Visit: Payer: Self-pay

## 2019-10-11 DIAGNOSIS — Z1211 Encounter for screening for malignant neoplasm of colon: Secondary | ICD-10-CM

## 2019-10-11 NOTE — Telephone Encounter (Signed)
Gastroenterology Pre-Procedure Review  Request Date: Thursday 12/01/19 Requesting Physician: Dr. Marius Ditch  PATIENT REVIEW QUESTIONS: The patient responded to the following health history questions as indicated:    1. Are you having any GI issues? yes (Right Sided Abdominal Pain.  Pt states her pcp is managing this.) 2. Do you have a personal history of Polyps? no 3. Do you have a family history of Colon Cancer or Polyps? no 4. Diabetes Mellitus? no 5. Joint replacements in the past 12 months?no 6. Major health problems in the past 3 months?no 7. Any artificial heart valves, MVP, or defibrillator?no    MEDICATIONS & ALLERGIES:    Patient reports the following regarding taking any anticoagulation/antiplatelet therapy:   Plavix, Coumadin, Eliquis, Xarelto, Lovenox, Pradaxa, Brilinta, or Effient? no Aspirin? no  Patient confirms/reports the following medications:  Current Outpatient Medications  Medication Sig Dispense Refill  . diphenhydrAMINE HCl (BENADRYL ALLERGY PO) Take by mouth.    . ergocalciferol (DRISDOL) 1.25 MG (50000 UT) capsule Take 1 capsule (50,000 Units total) by mouth once a week. 4 capsule 5  . etodolac (LODINE) 500 MG tablet Take 1 tablet (500 mg total) by mouth 2 (two) times daily. 45 tablet 2  . Ferrous Fumarate (HEMOCYTE) 324 (106 Fe) MG TABS tablet Take 1 tablet (106 mg of iron total) by mouth daily. 30 tablet 5  . ibuprofen (ADVIL) 200 MG tablet Take 200 mg by mouth every 6 (six) hours as needed.     No current facility-administered medications for this visit.    Patient confirms/reports the following allergies:  No Known Allergies  No orders of the defined types were placed in this encounter.   AUTHORIZATION INFORMATION Primary Insurance: 1D#: Group #:  Secondary Insurance: 1D#: Group #:  SCHEDULE INFORMATION: Date: 12/01/19 Time: Location:[facility]

## 2019-10-11 NOTE — Telephone Encounter (Signed)
-----   Message from Vanetta Mulders, [place] sent at 08/16/2019 11:07 AM EST ----- Regarding: Colonoscopy January 2021 Patient requested to have her colonoscopy in [place] after the holidays in 2021.

## 2019-10-11 NOTE — Telephone Encounter (Signed)
LVM for pt to f/u on scheduling her colonoscopy in [place].  Asked her to call the office back when she is ready to schedule.  Thanks [employer]

## 2019-10-24 ENCOUNTER — Telehealth: Payer: Self-pay

## 2019-10-24 NOTE — Telephone Encounter (Signed)
Patient returned call to reschedule her 12/01/19 Colonoscopy with Dr. Marius Ditch due to date being full already.  Her rescheduled Colonoscopy date is not 12/13/19 still at [place] [facility] 12/09/19.  [name] in Endo has been made aware of date change.  New instructions will be sent to patient to reflect new date.  Referral updated.  Thanks [employer]

## 2019-10-25 ENCOUNTER — Other Ambulatory Visit: Payer: Self-pay

## 2019-11-08 ENCOUNTER — Ambulatory Visit: Payer: BC Managed Care – PPO | Admitting: Nurse Practitioner

## 2019-12-06 ENCOUNTER — Telehealth: Payer: Self-pay

## 2019-12-06 NOTE — Telephone Encounter (Signed)
Returned patients call to reschedule her 12/13/19 colonoscopy with Dr. Marius Ditch in [place].  LVM for her to call office back to reschedule.  Will await call back to reschedule.  Thanks,  [place], [place]

## 2019-12-07 ENCOUNTER — Telehealth: Payer: Self-pay

## 2019-12-07 NOTE — Telephone Encounter (Signed)
LVM notifying patient of Dr. Verlin Grills availability for scheduling her colonoscopy at [facility] 3rd and 4th Thursdays.  Will await call back for her rescheduling.  Thanks,  [place], [place]

## 2019-12-07 NOTE — Telephone Encounter (Signed)
Patient returned call.  Colonoscopy has been rescheduled to April 15th at [facility] with Dr. Marius Ditch. New instructions mailed, referral updated.  Thanks,  [place], [place]

## 2019-12-13 ENCOUNTER — Ambulatory Visit: Admit: 2019-12-13 | Payer: BC Managed Care – PPO | Admitting: Gastroenterology

## 2019-12-13 SURGERY — COLONOSCOPY WITH PROPOFOL
Anesthesia: General

## 2019-12-26 ENCOUNTER — Ambulatory Visit: Payer: BC Managed Care – PPO | Attending: Internal Medicine

## 2019-12-26 DIAGNOSIS — Z23 Encounter for immunization: Secondary | ICD-10-CM

## 2019-12-26 NOTE — Progress Notes (Signed)
   Covid-19 Vaccination Clinic  Name:  Sydney French    MRN: [id number] DOB: Nov 16, 1970  12/26/2019  [name] was observed post Covid-19 immunization for 15 minutes without incident. She was provided with Vaccine Information Sheet and instruction to access the V-Safe system.   [name] was instructed to call 911 with any severe reactions post vaccine: Marland Kitchen Difficulty breathing  . Swelling of face and throat  . A fast heartbeat  . A bad rash all over body  . Dizziness and weakness   Immunizations Administered    Name Date Dose VIS Date Route   Pfizer COVID-19 Vaccine 12/26/2019 12:13 PM 0.3 mL 09/16/2019 Intramuscular   Manufacturer: [place]   Lot: [id number]   [place]: [id number]

## 2020-01-12 ENCOUNTER — Telehealth: Payer: Self-pay

## 2020-01-12 NOTE — Telephone Encounter (Signed)
Pt lvm to cancel her 01/19/20 colonoscopy at [facility] [address] [facility].  Referral has been noted.   Thanks,  [place], [place]

## 2020-01-17 ENCOUNTER — Ambulatory Visit: Payer: BC Managed Care – PPO | Attending: Internal Medicine

## 2020-01-17 DIAGNOSIS — Z23 Encounter for immunization: Secondary | ICD-10-CM

## 2020-01-17 NOTE — Progress Notes (Signed)
   Covid-19 Vaccination Clinic  Name:  Sydney French    MRN: [id number] DOB: May 29, 1971  01/17/2020  [name] was observed post Covid-19 immunization for 15 minutes without incident. She was provided with Vaccine Information Sheet and instruction to access the V-Safe system.   [name] was instructed to call 911 with any severe reactions post vaccine: Marland Kitchen Difficulty breathing  . Swelling of face and throat  . A fast heartbeat  . A bad rash all over body  . Dizziness and weakness   Immunizations Administered    Name Date Dose VIS Date Route   Pfizer COVID-19 Vaccine 01/17/2020  3:21 PM 0.3 mL 09/16/2019 Intramuscular   Manufacturer: [place]   Lot: [id number]   [place]: [id number]

## 2020-01-18 ENCOUNTER — Ambulatory Visit: Payer: BC Managed Care – PPO

## 2020-01-24 ENCOUNTER — Ambulatory Visit: Payer: BC Managed Care – PPO

## 2020-04-18 IMAGING — MG DIGITAL DIAGNOSTIC UNILATERAL RIGHT MAMMOGRAM WITH TOMO AND CAD
4 series · 4 of 12 positions shown · non-contrast
Comparison: Previous exam(s).

CLINICAL DATA: 47-year-old female recalled from screening mammogram
dated 08/24/2018 for possible right breast distortion.

EXAM:
DIGITAL DIAGNOSTIC RIGHT MAMMOGRAM WITH CAD AND TOMO
ULTRASOUND RIGHT BREAST

[R CC synth-2D]
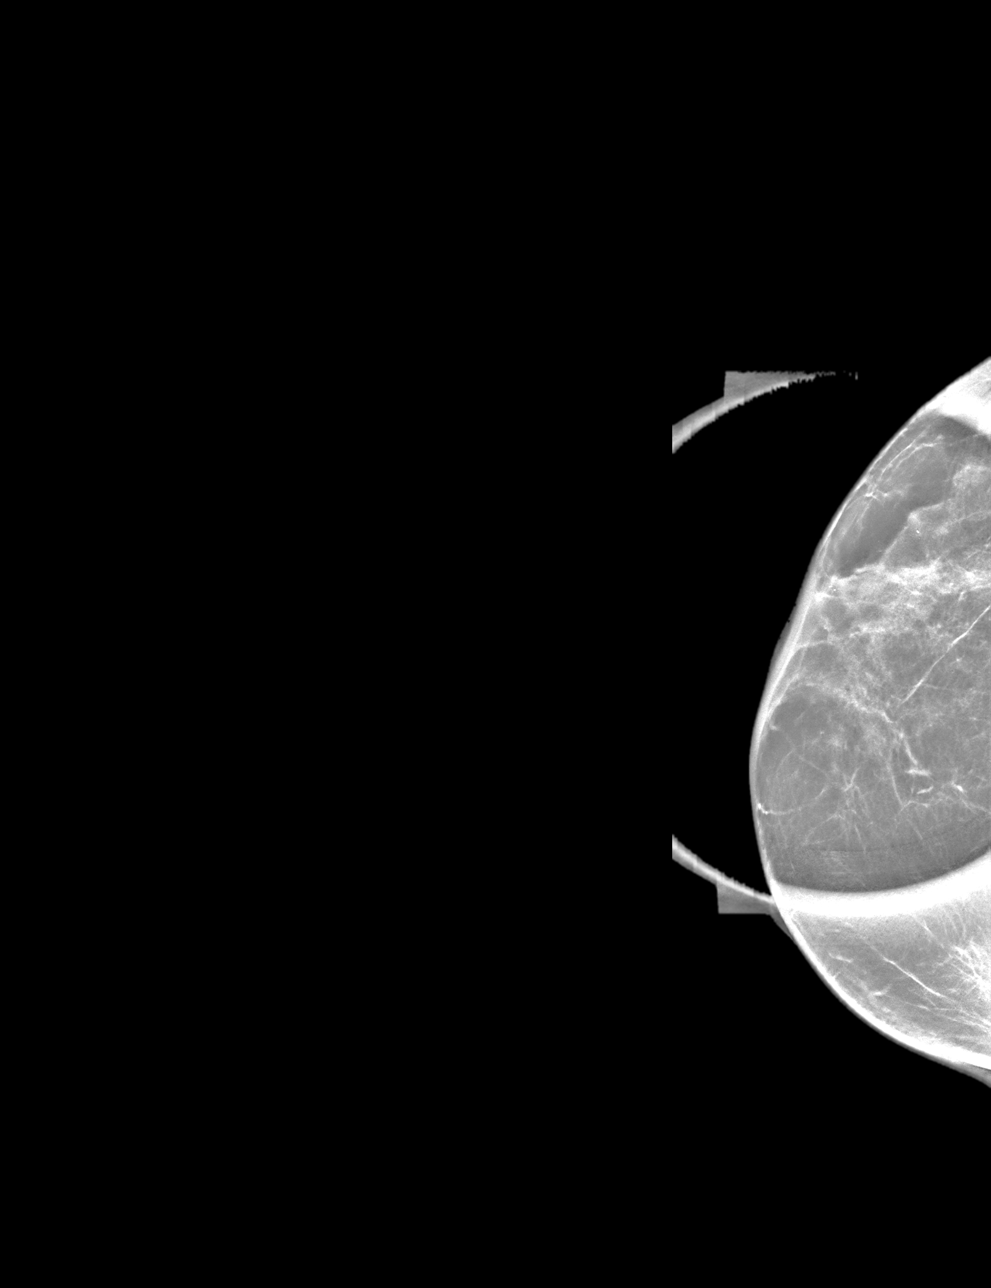

[R MLO synth-2D]
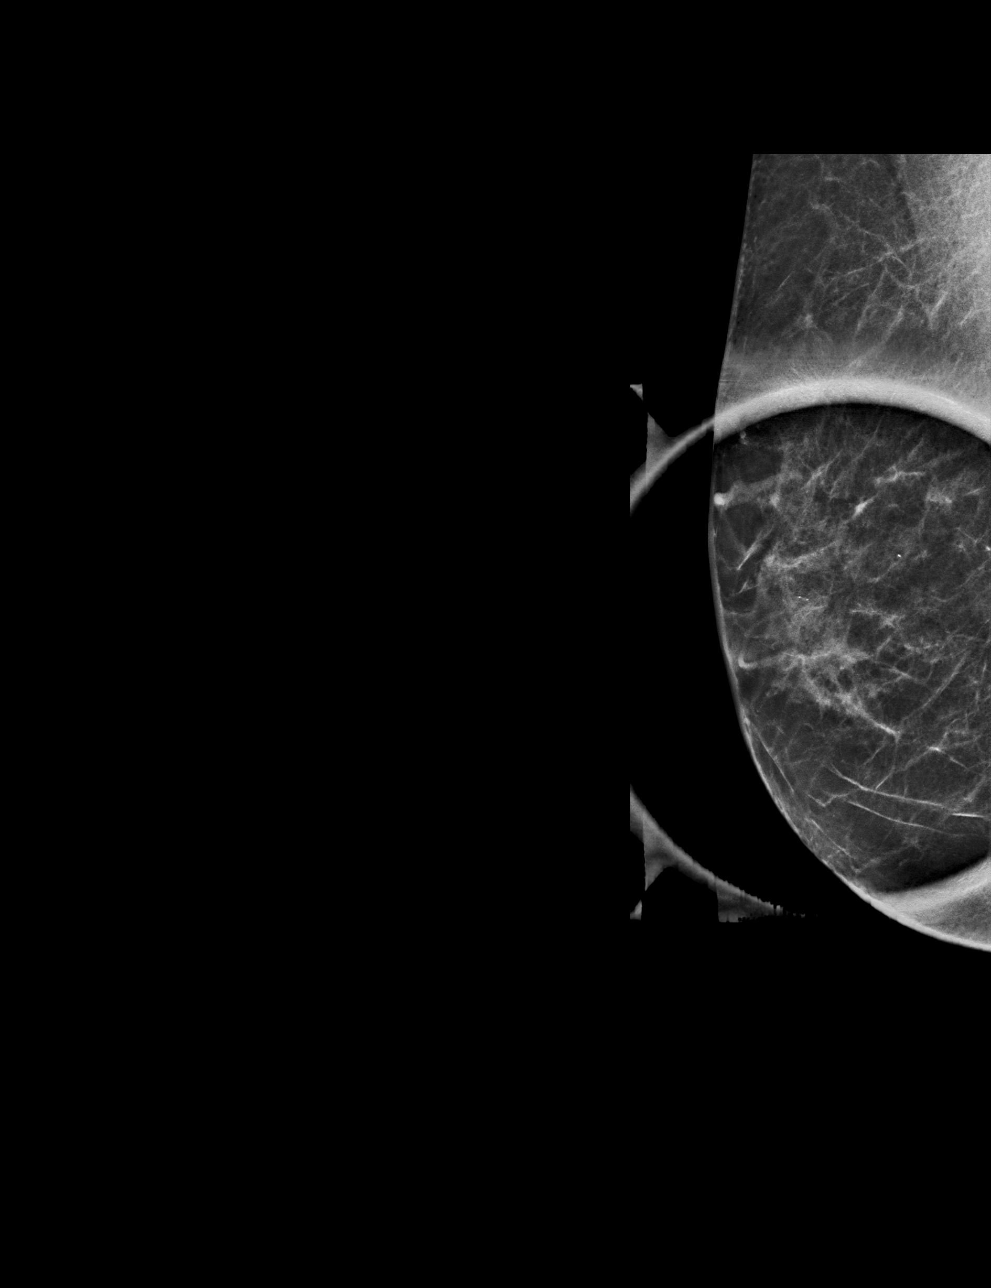

[R CC tomo · tomo slice 23/45.0]
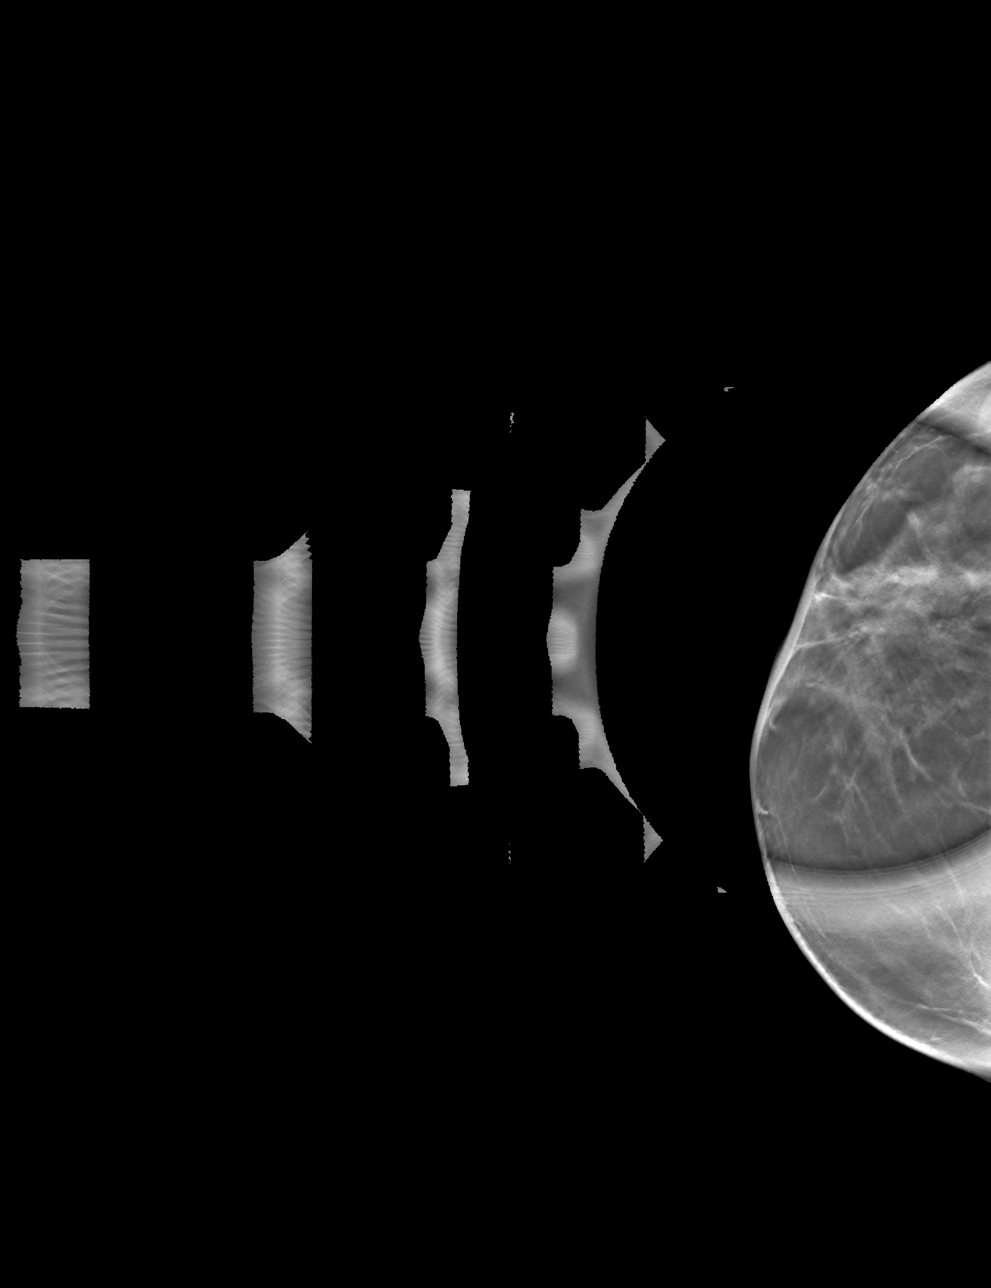

[R MLO tomo · tomo slice 22/43.0]
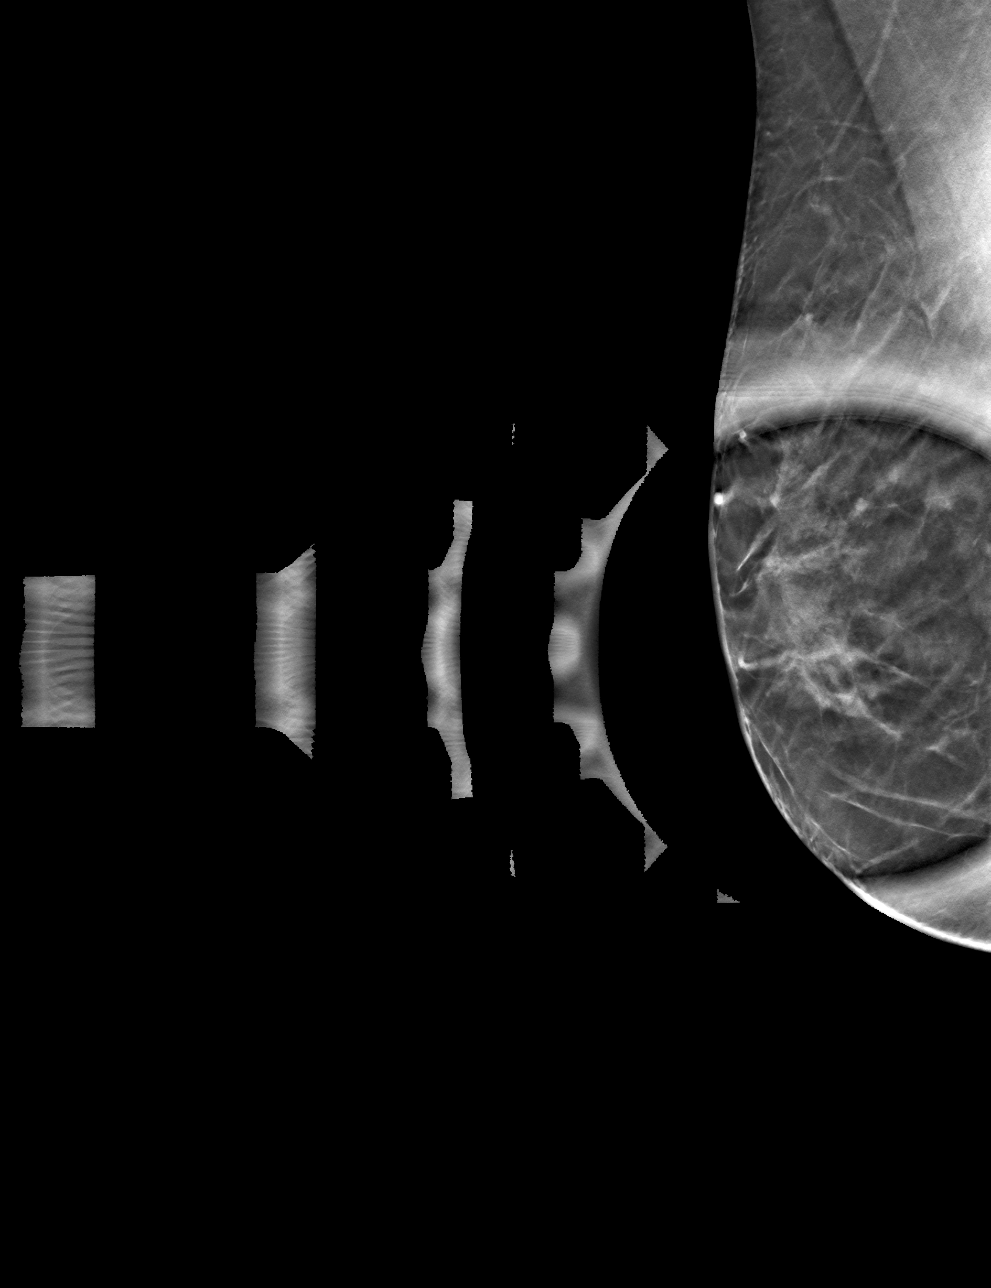

[4 of 12 positions shown; findings below may reference images not displayed]

ACR Breast Density Category c: The breast tissue is heterogeneously
dense, which may obscure small masses.
FINDINGS: Previously see, possible distortion in the subareolar right breast
is not definitively persist on today's additional mammographic
views. Precautionary ultrasound evaluation was performed.

Mammographic images were processed with CAD.

Targeted ultrasound is performed, showing normal fibroglandular
tissue without focal or suspicious sonographic abnormality.
Evaluation of the subareolar right breast was performed.
IMPRESSION: No persistent, suspicious mammographic or sonographic abnormalities
corresponding with the screening mammographic finding.

RECOMMENDATION:
Screening mammogram in one year.(Code:[id number])

I have discussed the findings and recommendations with the patient.
Results were also provided in writing at the conclusion of the
visit. If applicable, a reminder letter will be sent to the patient
regarding the next appointment.

BI-RADS CATEGORY  1: Negative.

## 2020-05-16 DIAGNOSIS — M7502 Adhesive capsulitis of left shoulder: Secondary | ICD-10-CM | POA: Diagnosis not present

## 2020-05-16 DIAGNOSIS — M5412 Radiculopathy, cervical region: Secondary | ICD-10-CM | POA: Diagnosis not present

## 2020-05-16 DIAGNOSIS — M79602 Pain in left arm: Secondary | ICD-10-CM | POA: Diagnosis not present

## 2020-05-16 DIAGNOSIS — M79601 Pain in right arm: Secondary | ICD-10-CM | POA: Diagnosis not present

## 2020-05-16 DIAGNOSIS — M25511 Pain in right shoulder: Secondary | ICD-10-CM | POA: Diagnosis not present

## 2020-05-16 DIAGNOSIS — M7582 Other shoulder lesions, left shoulder: Secondary | ICD-10-CM | POA: Diagnosis not present

## 2020-05-18 DIAGNOSIS — M542 Cervicalgia: Secondary | ICD-10-CM | POA: Diagnosis not present

## 2020-05-18 DIAGNOSIS — M25512 Pain in left shoulder: Secondary | ICD-10-CM | POA: Diagnosis not present

## 2020-05-18 DIAGNOSIS — M25511 Pain in right shoulder: Secondary | ICD-10-CM | POA: Diagnosis not present

## 2020-05-22 DIAGNOSIS — M25511 Pain in right shoulder: Secondary | ICD-10-CM | POA: Diagnosis not present

## 2020-05-22 DIAGNOSIS — M25512 Pain in left shoulder: Secondary | ICD-10-CM | POA: Diagnosis not present

## 2020-05-22 DIAGNOSIS — M542 Cervicalgia: Secondary | ICD-10-CM | POA: Diagnosis not present

## 2020-06-18 DIAGNOSIS — M7582 Other shoulder lesions, left shoulder: Secondary | ICD-10-CM | POA: Diagnosis not present

## 2020-06-18 DIAGNOSIS — M7502 Adhesive capsulitis of left shoulder: Secondary | ICD-10-CM | POA: Diagnosis not present

## 2020-06-18 DIAGNOSIS — M5412 Radiculopathy, cervical region: Secondary | ICD-10-CM | POA: Diagnosis not present

## 2020-06-18 DIAGNOSIS — M7671 Peroneal tendinitis, right leg: Secondary | ICD-10-CM | POA: Diagnosis not present

## 2020-06-27 DIAGNOSIS — D2262 Melanocytic nevi of left upper limb, including shoulder: Secondary | ICD-10-CM | POA: Diagnosis not present

## 2020-06-27 DIAGNOSIS — D2271 Melanocytic nevi of right lower limb, including hip: Secondary | ICD-10-CM | POA: Diagnosis not present

## 2020-06-27 DIAGNOSIS — D2261 Melanocytic nevi of right upper limb, including shoulder: Secondary | ICD-10-CM | POA: Diagnosis not present

## 2020-06-27 DIAGNOSIS — D225 Melanocytic nevi of trunk: Secondary | ICD-10-CM | POA: Diagnosis not present

## 2020-07-31 DIAGNOSIS — F4323 Adjustment disorder with mixed anxiety and depressed mood: Secondary | ICD-10-CM | POA: Diagnosis not present

## 2020-08-08 DIAGNOSIS — F4323 Adjustment disorder with mixed anxiety and depressed mood: Secondary | ICD-10-CM | POA: Diagnosis not present

## 2020-08-14 DIAGNOSIS — F4323 Adjustment disorder with mixed anxiety and depressed mood: Secondary | ICD-10-CM | POA: Diagnosis not present

## 2020-08-22 DIAGNOSIS — F4323 Adjustment disorder with mixed anxiety and depressed mood: Secondary | ICD-10-CM | POA: Diagnosis not present

## 2020-09-12 DIAGNOSIS — F4323 Adjustment disorder with mixed anxiety and depressed mood: Secondary | ICD-10-CM | POA: Diagnosis not present

## 2020-09-18 DIAGNOSIS — F4323 Adjustment disorder with mixed anxiety and depressed mood: Secondary | ICD-10-CM | POA: Diagnosis not present

## 2020-09-24 DIAGNOSIS — F4323 Adjustment disorder with mixed anxiety and depressed mood: Secondary | ICD-10-CM | POA: Diagnosis not present

## 2020-10-10 DIAGNOSIS — F4310 Post-traumatic stress disorder, unspecified: Secondary | ICD-10-CM | POA: Diagnosis not present

## 2020-10-16 ENCOUNTER — Encounter: Payer: Self-pay | Admitting: Nurse Practitioner

## 2020-10-17 DIAGNOSIS — F4310 Post-traumatic stress disorder, unspecified: Secondary | ICD-10-CM | POA: Diagnosis not present

## 2020-10-24 DIAGNOSIS — F4310 Post-traumatic stress disorder, unspecified: Secondary | ICD-10-CM | POA: Diagnosis not present

## 2020-10-31 DIAGNOSIS — F4310 Post-traumatic stress disorder, unspecified: Secondary | ICD-10-CM | POA: Diagnosis not present

## 2020-11-07 DIAGNOSIS — F4310 Post-traumatic stress disorder, unspecified: Secondary | ICD-10-CM | POA: Diagnosis not present

## 2020-11-13 ENCOUNTER — Encounter: Payer: Self-pay | Admitting: Internal Medicine

## 2020-11-14 DIAGNOSIS — F4310 Post-traumatic stress disorder, unspecified: Secondary | ICD-10-CM | POA: Diagnosis not present

## 2020-11-21 DIAGNOSIS — F4310 Post-traumatic stress disorder, unspecified: Secondary | ICD-10-CM | POA: Diagnosis not present

## 2020-11-27 ENCOUNTER — Other Ambulatory Visit: Payer: Self-pay

## 2020-11-27 ENCOUNTER — Ambulatory Visit: Payer: BC Managed Care – PPO | Admitting: Internal Medicine

## 2020-11-27 ENCOUNTER — Encounter: Payer: Self-pay | Admitting: Internal Medicine

## 2020-11-27 DIAGNOSIS — L908 Other atrophic disorders of skin: Secondary | ICD-10-CM

## 2020-11-27 DIAGNOSIS — Z1231 Encounter for screening mammogram for malignant neoplasm of breast: Secondary | ICD-10-CM

## 2020-11-27 DIAGNOSIS — E2839 Other primary ovarian failure: Secondary | ICD-10-CM

## 2020-11-27 DIAGNOSIS — Z0001 Encounter for general adult medical examination with abnormal findings: Secondary | ICD-10-CM | POA: Diagnosis not present

## 2020-11-27 DIAGNOSIS — H6123 Impacted cerumen, bilateral: Secondary | ICD-10-CM | POA: Diagnosis not present

## 2020-11-27 DIAGNOSIS — R300 Dysuria: Secondary | ICD-10-CM

## 2020-11-27 DIAGNOSIS — L659 Nonscarring hair loss, unspecified: Secondary | ICD-10-CM

## 2020-11-27 MED ORDER — TRETINOIN 0.025 % EX CREA: TOPICAL_CREAM | Freq: Every day | CUTANEOUS | 0 refills | Status: DC

## 2020-11-27 NOTE — Progress Notes (Signed)
[facility], [place] [address]  Internal MEDICINE  Office Visit Note  Patient Name: Sydney French  [id number]  [id number]  Date of Service: 11/27/2020  Chief Complaint  Patient presents with  . Annual Exam    Hair loss noticed in last 6 months, wrinkles, menopause     HPI Pt is here for routine health maintenance examination.  She feels well except few complaints, has been having hot flashes, has fhx of breast problems ( not sure if CA) Concerned about hair loss and wrinkles, does not take any vitamins regularly She is willing to get colon cancer screening  Current Medication: Outpatient Encounter Medications as of 11/27/2020  Medication Sig  . diphenhydrAMINE HCl (BENADRYL ALLERGY PO) Take by mouth.  . ergocalciferol (DRISDOL) 1.25 MG (50000 UT) capsule Take 1 capsule (50,000 Units total) by mouth once a week.  . etodolac (LODINE) 500 MG tablet Take 1 tablet (500 mg total) by mouth 2 (two) times daily.  . Ferrous Fumarate (HEMOCYTE) 324 (106 Fe) MG TABS tablet Take 1 tablet (106 mg of iron total) by mouth daily.  Marland Kitchen ibuprofen (ADVIL) 200 MG tablet Take 200 mg by mouth every 6 (six) hours as needed.  . tretinoin (RETIN-A) 0.025 % cream Apply topically at bedtime.   No facility-administered encounter medications on file as of 11/27/2020.    Surgical History: Past Surgical History:  Procedure Laterality Date  . CESAREAN SECTION    . child birth natural     1995, 1998, 2003  . CHOLECYSTECTOMY  1993  . DILATION AND CURETTAGE OF UTERUS    . GALLBLADDER SURGERY      Medical History: Past Medical History:  Diagnosis Date  . Endometriosis   . History of ovarian cyst   . Irregular periods   . Osteoarthritis     Family History: Family History  Problem Relation Age of Onset  . Cancer Mother   . Heart attack Father   . Asthma Neg Hx   . Breast cancer Neg Hx   . Stroke Neg Hx   . Glaucoma Neg Hx   . Arthritis Neg Hx     Social  History: Social History   Socioeconomic History  . Marital status: Married    Spouse name: Not on file  . Number of children: Not on file  . Years of education: Not on file  . Highest education level: Not on file  Occupational History  . Not on file  Tobacco Use  . Smoking status: Never Smoker  . Smokeless tobacco: Never Used  Vaping Use  . Vaping Use: Not on file  Substance and Sexual Activity  . Alcohol use: No  . Drug use: No  . Sexual activity: Yes  Other Topics Concern  . Not on file  Social History Narrative  . Not on file   Social Determinants of Health   Financial Resource Strain: Not on file  Food Insecurity: Not on file  Transportation Needs: Not on file  Physical Activity: Not on file  Stress: Not on file  Social Connections: Not on file      Review of Systems  Constitutional: Negative for chills, diaphoresis and fatigue.  HENT: Negative for ear pain, postnasal drip and sinus pressure.   Eyes: Negative for photophobia, discharge, redness, itching and visual disturbance.  Respiratory: Negative for cough, shortness of breath and wheezing.   Cardiovascular: Negative for chest pain, palpitations and leg swelling.  Gastrointestinal: Negative for abdominal pain, constipation, diarrhea,  nausea and vomiting.  Genitourinary: Negative for dysuria and flank pain.  Musculoskeletal: Negative for arthralgias, back pain, gait problem and neck pain.  Skin: Negative for color change.       Hair loss  Allergic/Immunologic: Negative for environmental allergies and food allergies.  Neurological: Negative for dizziness and headaches.  Hematological: Does not bruise/bleed easily.  Psychiatric/Behavioral: Negative for agitation, behavioral problems (depression) and hallucinations.     Vital Signs: BP 102/78   Pulse 90   Temp (!) 97.4 F (36.3 C)   Resp 16   Ht 5\' 4"  (1.626 m)   Wt 114 lb 12.8 oz (52.1 kg)   SpO2 98%   BMI 19.71 kg/m    Physical  Exam Constitutional:      General: She is not in acute distress.    Appearance: She is well-developed. She is not diaphoretic.  HENT:     Head: Normocephalic and atraumatic.     Right Ear: There is impacted cerumen.     Left Ear: There is impacted cerumen.     Mouth/Throat:     Pharynx: No oropharyngeal exudate.  Eyes:     Pupils: Pupils are equal, round, and reactive to light.  Neck:     Thyroid: No thyromegaly.     Vascular: No JVD.     Trachea: No tracheal deviation.  Cardiovascular:     Rate and Rhythm: Normal rate and regular rhythm.     Heart sounds: Normal heart sounds. No murmur heard. No friction rub. No gallop.   Pulmonary:     Effort: Pulmonary effort is normal. No respiratory distress.     Breath sounds: No wheezing or rales.  Chest:     Chest wall: No tenderness.  Breasts:     Right: Normal.     Left: Normal.    Abdominal:     General: Bowel sounds are normal.     Palpations: Abdomen is soft.  Musculoskeletal:        General: Normal range of motion.     Cervical back: Normal range of motion and neck supple.  Lymphadenopathy:     Cervical: No cervical adenopathy.  Skin:    General: Skin is warm and dry.  Neurological:     Mental Status: She is alert and oriented to person, place, and time.     Cranial Nerves: No cranial nerve deficit.  Psychiatric:        Behavior: Behavior normal.        Thought Content: Thought content normal.        Judgment: Judgment normal.       Assessment/Plan: 1. Encounter for general adult medical examination with abnormal findings Age appropriate labs and diagnostics are ordered, cologaurd is ordered  - CBC with Differential/Platelet - Lipid Panel With LDL/HDL Ratio - TSH - T4, free - Comprehensive metabolic panel - Ferritin; Future  2. Encounter for mammogram to establish baseline mammogram - MM DIGITAL SCREENING BILATERAL; Future  3. Other primary ovarian failure - DG Bone Density; Future  4. Bilateral  impacted cerumen - Ear Lavage is performed with good results   5. Skin aging Will start on Retin A. - tretinoin (RETIN-A) 0.025 % cream; Apply topically at bedtime.  Dispense: 45 g; Refill: 0  6. Hair loss Pt has h/o IAD, encouraged to take iron, she is also menopausal, high risk for HRT, h/o ovarian cyst  - CBC with Differential/Platelet - TSH - Ferritin; Future  7. Dysuria - UA/M w/rflx Culture, Routine  General  Counseling: suly vukelich understanding of the findings of todays visit and agrees with plan of treatment. I have discussed any further diagnostic evaluation that may be needed or ordered today. We also reviewed her medications today. she has been encouraged to call the office with any questions or concerns that should arise related to todays visit.   Orders Placed This Encounter  Procedures  . DG Bone Density  . MM DIGITAL SCREENING BILATERAL  . UA/M w/rflx Culture, Routine  . CBC with Differential/Platelet  . Lipid Panel With LDL/HDL Ratio  . TSH  . T4, free  . Comprehensive metabolic panel  . Ferritin  . Ear Lavage    Meds ordered this encounter  Medications  . tretinoin (RETIN-A) 0.025 % cream    Sig: Apply topically at bedtime.    Dispense:  45 g    Refill:  0    Total time spent:30Minutes  Time spent includes review of chart, medications, test results, and follow up plan with the patient.     Lavera Guise, MD  Internal Medicine

## 2020-11-28 ENCOUNTER — Telehealth: Payer: Self-pay

## 2020-11-28 DIAGNOSIS — Z0001 Encounter for general adult medical examination with abnormal findings: Secondary | ICD-10-CM | POA: Diagnosis not present

## 2020-11-28 DIAGNOSIS — Z1231 Encounter for screening mammogram for malignant neoplasm of breast: Secondary | ICD-10-CM | POA: Diagnosis not present

## 2020-11-28 DIAGNOSIS — E2839 Other primary ovarian failure: Secondary | ICD-10-CM | POA: Diagnosis not present

## 2020-11-28 DIAGNOSIS — L659 Nonscarring hair loss, unspecified: Secondary | ICD-10-CM | POA: Diagnosis not present

## 2020-11-28 DIAGNOSIS — R300 Dysuria: Secondary | ICD-10-CM | POA: Diagnosis not present

## 2020-11-28 LAB — UA/M W/RFLX CULTURE, ROUTINE
Bilirubin, UA: NEGATIVE
Glucose, UA: NEGATIVE
Ketones, UA: NEGATIVE
Leukocytes,UA: NEGATIVE
Nitrite, UA: NEGATIVE
Protein,UA: NEGATIVE
RBC, UA: NEGATIVE
Specific Gravity, UA: 1.008 (ref 1.005–1.030)
Urobilinogen, Ur: 0.2 mg/dL (ref 0.2–1.0)
pH, UA: 6.5 (ref 5.0–7.5)

## 2020-11-28 LAB — MICROSCOPIC EXAMINATION
Bacteria, UA: NONE SEEN
Casts: NONE SEEN /lpf
Epithelial Cells (non renal): NONE SEEN /hpf (ref 0–10)
RBC, Urine: NONE SEEN /hpf (ref 0–2)
WBC, UA: NONE SEEN /hpf (ref 0–5)

## 2020-11-28 NOTE — Telephone Encounter (Signed)
Faxed cologuard 

## 2020-11-29 ENCOUNTER — Other Ambulatory Visit: Payer: Self-pay

## 2020-11-29 ENCOUNTER — Telehealth: Payer: Self-pay

## 2020-11-29 DIAGNOSIS — D509 Iron deficiency anemia, unspecified: Secondary | ICD-10-CM

## 2020-11-29 DIAGNOSIS — E559 Vitamin D deficiency, unspecified: Secondary | ICD-10-CM

## 2020-11-29 DIAGNOSIS — M150 Primary generalized (osteo)arthritis: Secondary | ICD-10-CM

## 2020-11-29 LAB — CBC WITH DIFFERENTIAL/PLATELET
Basophils Absolute: 0.1 10*3/uL (ref 0.0–0.2)
Basos: 1 %
EOS (ABSOLUTE): 0.1 10*3/uL (ref 0.0–0.4)
Eos: 1 %
Hematocrit: 39.3 % (ref 34.0–46.6)
Hemoglobin: 12.9 g/dL (ref 11.1–15.9)
Immature Grans (Abs): 0 10*3/uL (ref 0.0–0.1)
Immature Granulocytes: 0 %
Lymphocytes Absolute: 2.1 10*3/uL (ref 0.7–3.1)
Lymphs: 37 %
MCH: 30.3 pg (ref 26.6–33.0)
MCHC: 32.8 g/dL (ref 31.5–35.7)
MCV: 92 fL (ref 79–97)
Monocytes Absolute: 0.4 10*3/uL (ref 0.1–0.9)
Monocytes: 8 %
Neutrophils Absolute: 2.9 10*3/uL (ref 1.4–7.0)
Neutrophils: 53 %
Platelets: 242 10*3/uL (ref 150–450)
RBC: 4.26 x10E6/uL (ref 3.77–5.28)
RDW: 13.4 % (ref 11.7–15.4)
WBC: 5.6 10*3/uL (ref 3.4–10.8)

## 2020-11-29 LAB — COMPREHENSIVE METABOLIC PANEL
ALT: 14 IU/L (ref 0–32)
AST: 22 IU/L (ref 0–40)
Albumin/Globulin Ratio: 1.4 (ref 1.2–2.2)
Albumin: 4.5 g/dL (ref 3.8–4.9)
Alkaline Phosphatase: 68 IU/L (ref 44–121)
BUN/Creatinine Ratio: 15 (ref 9–23)
BUN: 9 mg/dL (ref 6–24)
Bilirubin Total: 0.5 mg/dL (ref 0.0–1.2)
CO2: 21 mmol/L (ref 20–29)
Calcium: 9.5 mg/dL (ref 8.7–10.2)
Chloride: 98 mmol/L (ref 96–106)
Creatinine, Ser: 0.62 mg/dL (ref 0.57–1.00)
GFR calc Af Amer: 121 mL/min/{1.73_m2} (ref >59)
GFR calc non Af Amer: 105 mL/min/{1.73_m2} (ref >59)
Globulin, Total: 3.3 g/dL (ref 1.5–4.5)
Glucose: 93 mg/dL (ref 65–99)
Potassium: 4.1 mmol/L (ref 3.5–5.2)
Sodium: 135 mmol/L (ref 134–144)
Total Protein: 7.8 g/dL (ref 6.0–8.5)

## 2020-11-29 LAB — TSH: TSH: 0.795 u[IU]/mL (ref 0.450–4.500)

## 2020-11-29 LAB — LIPID PANEL WITH LDL/HDL RATIO
Cholesterol, Total: 179 mg/dL (ref 100–199)
HDL: 67 mg/dL (ref >39)
LDL Chol Calc (NIH): 104 mg/dL — ABNORMAL HIGH (ref 0–99)
LDL/HDL Ratio: 1.6 ratio (ref 0.0–3.2)
Triglycerides: 41 mg/dL (ref 0–149)
VLDL Cholesterol Cal: 8 mg/dL (ref 5–40)

## 2020-11-29 LAB — T4, FREE: Free T4: 1.28 ng/dL (ref 0.82–1.77)

## 2020-11-29 MED ORDER — FERROUS FUMARATE 324 (106 FE) MG PO TABS: 1.0000 | ORAL_TABLET | Freq: Every day | ORAL | 5 refills | Status: AC

## 2020-11-29 MED ORDER — ETODOLAC 500 MG PO TABS: 500.0000 mg | ORAL_TABLET | Freq: Two times a day (BID) | ORAL | 2 refills | Status: DC

## 2020-11-29 MED ORDER — ERGOCALCIFEROL 1.25 MG (50000 UT) PO CAPS: 50000.0000 [IU] | ORAL_CAPSULE | ORAL | 5 refills | Status: DC

## 2020-11-29 NOTE — Telephone Encounter (Signed)
Pt notified for labs  

## 2020-11-29 NOTE — Telephone Encounter (Signed)
-----   Message from Lavera Guise, MD sent at 11/29/2020  9:16 AM EST ----- Labs reviewed and all in normal limits

## 2020-11-30 ENCOUNTER — Telehealth: Payer: Self-pay

## 2020-11-30 NOTE — Telephone Encounter (Signed)
Faxed cologuard order requisition form

## 2020-12-03 DIAGNOSIS — L908 Other atrophic disorders of skin: Secondary | ICD-10-CM | POA: Insufficient documentation

## 2020-12-05 DIAGNOSIS — F4310 Post-traumatic stress disorder, unspecified: Secondary | ICD-10-CM | POA: Diagnosis not present

## 2020-12-19 ENCOUNTER — Other Ambulatory Visit: Payer: Self-pay

## 2020-12-19 ENCOUNTER — Ambulatory Visit
Admission: RE | Admit: 2020-12-19 | Discharge: 2020-12-19 | Disposition: A | Discharge status: Home / Self Care | Payer: BC Managed Care – PPO | Source: Ambulatory Visit | Attending: Internal Medicine | Admitting: Internal Medicine

## 2020-12-19 DIAGNOSIS — Z1231 Encounter for screening mammogram for malignant neoplasm of breast: Secondary | ICD-10-CM

## 2020-12-19 DIAGNOSIS — F4310 Post-traumatic stress disorder, unspecified: Secondary | ICD-10-CM | POA: Diagnosis not present

## 2020-12-19 DIAGNOSIS — E2839 Other primary ovarian failure: Secondary | ICD-10-CM

## 2020-12-19 DIAGNOSIS — M85852 Other specified disorders of bone density and structure, left thigh: Secondary | ICD-10-CM | POA: Diagnosis not present

## 2020-12-20 ENCOUNTER — Telehealth: Payer: Self-pay

## 2020-12-20 NOTE — Telephone Encounter (Signed)
I informed pt she has osteopenia on BMD and she needs to optimize, calcium and Vit d intake

## 2020-12-20 NOTE — Progress Notes (Signed)
Pt has osteopenia on BMD. Needs to optimize, calcium and Vit d intake

## 2020-12-20 NOTE — Telephone Encounter (Signed)
-----   Message from Lavera Guise, MD sent at 12/20/2020  7:18 AM EDT ----- Pt has osteopenia on BMD. Needs to optimize, calcium and Vit d intake

## 2020-12-21 DIAGNOSIS — Z1211 Encounter for screening for malignant neoplasm of colon: Secondary | ICD-10-CM | POA: Diagnosis not present

## 2020-12-21 DIAGNOSIS — Z1212 Encounter for screening for malignant neoplasm of rectum: Secondary | ICD-10-CM | POA: Diagnosis not present

## 2020-12-26 NOTE — Progress Notes (Signed)
Please advise pt

## 2020-12-29 ENCOUNTER — Telehealth: Payer: Self-pay

## 2020-12-29 NOTE — Telephone Encounter (Signed)
PA for RETIN A Cream was denied on 12/03/20

## 2021-01-02 ENCOUNTER — Telehealth: Payer: Self-pay

## 2021-01-02 DIAGNOSIS — F4310 Post-traumatic stress disorder, unspecified: Secondary | ICD-10-CM | POA: Diagnosis not present

## 2021-01-02 NOTE — Telephone Encounter (Signed)
Spoke with pt, informed her cologuard results are negative

## 2021-01-04 LAB — COLOGUARD

## 2021-01-16 DIAGNOSIS — F4310 Post-traumatic stress disorder, unspecified: Secondary | ICD-10-CM | POA: Diagnosis not present

## 2021-02-04 ENCOUNTER — Telehealth: Payer: Self-pay

## 2021-02-04 DIAGNOSIS — L908 Other atrophic disorders of skin: Secondary | ICD-10-CM

## 2021-02-04 MED ORDER — TRETINOIN 0.025 % EX CREA: TOPICAL_CREAM | Freq: Every day | CUTANEOUS | 3 refills | Status: AC

## 2021-02-04 NOTE — Telephone Encounter (Signed)
PA for TRETINOIN 0.025% Cream approved on 02/04/21 and valid from 02/04/21 to 02/03/22.  Sent new rx ro pharmacy

## 2021-02-06 DIAGNOSIS — F4310 Post-traumatic stress disorder, unspecified: Secondary | ICD-10-CM | POA: Diagnosis not present

## 2021-02-27 DIAGNOSIS — F4310 Post-traumatic stress disorder, unspecified: Secondary | ICD-10-CM | POA: Diagnosis not present

## 2021-03-20 DIAGNOSIS — F4310 Post-traumatic stress disorder, unspecified: Secondary | ICD-10-CM | POA: Diagnosis not present

## 2021-04-25 DIAGNOSIS — F4310 Post-traumatic stress disorder, unspecified: Secondary | ICD-10-CM | POA: Diagnosis not present

## 2021-05-15 DIAGNOSIS — F4310 Post-traumatic stress disorder, unspecified: Secondary | ICD-10-CM | POA: Diagnosis not present

## 2021-06-06 DIAGNOSIS — F4310 Post-traumatic stress disorder, unspecified: Secondary | ICD-10-CM | POA: Diagnosis not present

## 2021-06-26 DIAGNOSIS — F4310 Post-traumatic stress disorder, unspecified: Secondary | ICD-10-CM | POA: Diagnosis not present

## 2021-07-16 DIAGNOSIS — D2262 Melanocytic nevi of left upper limb, including shoulder: Secondary | ICD-10-CM | POA: Diagnosis not present

## 2021-07-16 DIAGNOSIS — D2261 Melanocytic nevi of right upper limb, including shoulder: Secondary | ICD-10-CM | POA: Diagnosis not present

## 2021-07-16 DIAGNOSIS — D2239 Melanocytic nevi of other parts of face: Secondary | ICD-10-CM | POA: Diagnosis not present

## 2021-07-16 DIAGNOSIS — D225 Melanocytic nevi of trunk: Secondary | ICD-10-CM | POA: Diagnosis not present

## 2021-07-16 DIAGNOSIS — L738 Other specified follicular disorders: Secondary | ICD-10-CM | POA: Diagnosis not present

## 2021-07-16 DIAGNOSIS — D485 Neoplasm of uncertain behavior of skin: Secondary | ICD-10-CM | POA: Diagnosis not present

## 2021-07-17 DIAGNOSIS — F4310 Post-traumatic stress disorder, unspecified: Secondary | ICD-10-CM | POA: Diagnosis not present

## 2021-08-08 DIAGNOSIS — F4310 Post-traumatic stress disorder, unspecified: Secondary | ICD-10-CM | POA: Diagnosis not present

## 2021-09-04 DIAGNOSIS — F4310 Post-traumatic stress disorder, unspecified: Secondary | ICD-10-CM | POA: Diagnosis not present

## 2021-09-18 DIAGNOSIS — F4310 Post-traumatic stress disorder, unspecified: Secondary | ICD-10-CM | POA: Diagnosis not present

## 2021-10-10 DIAGNOSIS — F4310 Post-traumatic stress disorder, unspecified: Secondary | ICD-10-CM | POA: Diagnosis not present

## 2021-10-24 DIAGNOSIS — F4310 Post-traumatic stress disorder, unspecified: Secondary | ICD-10-CM | POA: Diagnosis not present

## 2021-11-20 DIAGNOSIS — F4310 Post-traumatic stress disorder, unspecified: Secondary | ICD-10-CM | POA: Diagnosis not present

## 2021-11-25 ENCOUNTER — Other Ambulatory Visit: Payer: Self-pay | Admitting: Internal Medicine

## 2021-11-25 DIAGNOSIS — Z1231 Encounter for screening mammogram for malignant neoplasm of breast: Secondary | ICD-10-CM

## 2021-11-28 ENCOUNTER — Other Ambulatory Visit: Payer: Self-pay

## 2021-11-28 ENCOUNTER — Ambulatory Visit (INDEPENDENT_AMBULATORY_CARE_PROVIDER_SITE_OTHER): Payer: BC Managed Care – PPO | Admitting: Nurse Practitioner

## 2021-11-28 ENCOUNTER — Encounter: Payer: Self-pay | Admitting: Nurse Practitioner

## 2021-11-28 VITALS — BP 98/72 | HR 85 | Temp 98.5°F | Resp 16 | Ht 64.0 in | Wt 115.6 lb

## 2021-11-28 DIAGNOSIS — E782 Mixed hyperlipidemia: Secondary | ICD-10-CM

## 2021-11-28 DIAGNOSIS — E559 Vitamin D deficiency, unspecified: Secondary | ICD-10-CM | POA: Diagnosis not present

## 2021-11-28 DIAGNOSIS — D509 Iron deficiency anemia, unspecified: Secondary | ICD-10-CM | POA: Diagnosis not present

## 2021-11-28 DIAGNOSIS — R300 Dysuria: Secondary | ICD-10-CM | POA: Diagnosis not present

## 2021-11-28 DIAGNOSIS — Z0001 Encounter for general adult medical examination with abnormal findings: Secondary | ICD-10-CM | POA: Diagnosis not present

## 2021-11-28 DIAGNOSIS — H6123 Impacted cerumen, bilateral: Secondary | ICD-10-CM

## 2021-11-28 DIAGNOSIS — Z113 Encounter for screening for infections with a predominantly sexual mode of transmission: Secondary | ICD-10-CM

## 2021-11-28 DIAGNOSIS — N760 Acute vaginitis: Secondary | ICD-10-CM | POA: Diagnosis not present

## 2021-11-28 DIAGNOSIS — L659 Nonscarring hair loss, unspecified: Secondary | ICD-10-CM

## 2021-11-28 DIAGNOSIS — Z23 Encounter for immunization: Secondary | ICD-10-CM

## 2021-11-28 DIAGNOSIS — E538 Deficiency of other specified B group vitamins: Secondary | ICD-10-CM

## 2021-11-28 DIAGNOSIS — Z124 Encounter for screening for malignant neoplasm of cervix: Secondary | ICD-10-CM

## 2021-11-28 MED ORDER — MINOXIDIL 10 MG PO TABS: 10.0000 mg | ORAL_TABLET | Freq: Every day | ORAL | 2 refills | Status: DC

## 2021-11-28 MED ORDER — ZOSTER VAC RECOMB ADJUVANTED 50 MCG/0.5ML IM SUSR: 0.5000 mL | Freq: Once | INTRAMUSCULAR | 0 refills | Status: AC

## 2021-11-28 NOTE — Progress Notes (Signed)
[facility] [address], [place] [address]  Internal MEDICINE  Office Visit Note  Patient Name: Sydney French  [id number]  [id number]  Date of Service: 11/28/2021  Chief Complaint  Patient presents with   Annual Exam    Hair loss, discuss skin    HPI [name] presents for an annual well visit and physical exam.  She is a well-appearing 51 year old female with osteoarthritis and a history of iron deficiency anemia.  Her Cologuard stool test was negative last year she is wanting to do the Cologuard test on a yearly basis although it is only recommended every 3 years.  She will call her insurance and check with them to see if they would cover the Cologuard test yearly.  She is due for Pap smear and routine labs.  She is interested in getting the shingles vaccine.  Her weight and BMI are normal and her vital signs are within normal limits as well She has been dealing with some hair loss and some thin skin.     Current Medication: Outpatient Encounter Medications as of 11/28/2021  Medication Sig   diphenhydrAMINE HCl (BENADRYL ALLERGY PO) Take by mouth.   ergocalciferol (DRISDOL) 1.25 MG (50000 UT) capsule Take 1 capsule (50,000 Units total) by mouth once a week.   etodolac (LODINE) 500 MG tablet Take 1 tablet (500 mg total) by mouth 2 (two) times daily.   Ferrous Fumarate (HEMOCYTE) 324 (106 Fe) MG TABS tablet Take 1 tablet (106 mg of iron total) by mouth daily.   ibuprofen (ADVIL) 200 MG tablet Take 200 mg by mouth every 6 (six) hours as needed.   minoxidil (LONITEN) 10 MG tablet Take 1 tablet (10 mg total) by mouth daily.   tretinoin (RETIN-A) 0.025 % cream Apply topically at bedtime.   [DISCONTINUED] Zoster Vaccine Adjuvanted [facility]) injection Inject 0.5 mLs into the muscle once.   [EXPIRED] Zoster Vaccine Adjuvanted [facility]) injection Inject 0.5 mLs into the muscle once for 1 dose.   No facility-administered encounter medications on file as of 11/28/2021.     Surgical History: Past Surgical History:  Procedure Laterality Date   CESAREAN SECTION     child birth natural     1995, 1998, 2003   CHOLECYSTECTOMY  9   DILATION AND CURETTAGE OF UTERUS     GALLBLADDER SURGERY      Medical History: Past Medical History:  Diagnosis Date   Endometriosis    History of ovarian cyst    Irregular periods    Osteoarthritis     Family History: Family History  Problem Relation Age of Onset   Melanoma Mother    Cancer Mother    Heart attack Father    Melanoma Brother    Asthma Neg Hx    Breast cancer Neg Hx    Stroke Neg Hx    Glaucoma Neg Hx    Arthritis Neg Hx     Social History   Socioeconomic History   Marital status: Married    Spouse name: Not on file   Number of children: Not on file   Years of education: Not on file   Highest education level: Not on file  Occupational History   Not on file  Tobacco Use   Smoking status: Never   Smokeless tobacco: Never  Vaping Use   Vaping Use: Not on file  Substance and Sexual Activity   Alcohol use: No   Drug use: No   Sexual activity: Yes  Other Topics Concern  Not on file  Social History Narrative   Not on file   Social Determinants of Health   Financial Resource Strain: Not on file  Food Insecurity: Not on file  Transportation Needs: Not on file  Physical Activity: Not on file  Stress: Not on file  Social Connections: Not on file  Intimate Partner Violence: Not on file      Review of Systems  Constitutional:  Negative for activity change, appetite change, chills, fatigue, fever and unexpected weight change.  HENT: Negative.  Negative for congestion, ear pain, rhinorrhea, sore throat and trouble swallowing.   Eyes: Negative.   Respiratory: Negative.  Negative for cough, chest tightness, shortness of breath and wheezing.   Cardiovascular: Negative.  Negative for chest pain.  Gastrointestinal: Negative.  Negative for abdominal pain, blood in stool, constipation,  diarrhea, nausea and vomiting.  Endocrine: Negative.   Genitourinary: Negative.  Negative for difficulty urinating, dysuria, frequency, hematuria and urgency.  Musculoskeletal: Negative.  Negative for arthralgias, back pain, joint swelling, myalgias and neck pain.  Skin: Negative.  Negative for rash and wound.  Allergic/Immunologic: Negative.  Negative for immunocompromised state.  Neurological: Negative.  Negative for dizziness, seizures, numbness and headaches.  Hematological: Negative.   Psychiatric/Behavioral: Negative.  Negative for behavioral problems, self-injury and suicidal ideas. The patient is not nervous/anxious.    Vital Signs: BP 98/72    Pulse 85    Temp 98.5 F (36.9 C)    Resp 16    Ht _0  (1.626 m)    Wt 115 lb 9.6 oz (52.4 kg)    SpO2 98%    BMI 19.84 kg/m    Physical Exam Vitals reviewed.  Constitutional:      General: She is awake. She is not in acute distress.    Appearance: Normal appearance. She is well-developed, well-groomed and normal weight. She is not ill-appearing or diaphoretic.  HENT:     Head: Normocephalic and atraumatic.     Right Ear: External ear normal. There is impacted cerumen.     Left Ear: External ear normal. There is impacted cerumen.     Nose: Nose normal. No congestion or rhinorrhea.     Mouth/Throat:     Lips: Pink.     Mouth: Mucous membranes are moist.     Pharynx: Oropharynx is clear. Uvula midline. No oropharyngeal exudate or posterior oropharyngeal erythema.  Eyes:     General: Lids are normal. Vision grossly intact. Gaze aligned appropriately. No scleral icterus.       Right eye: No discharge.        Left eye: No discharge.     Extraocular Movements: Extraocular movements intact.     Conjunctiva/sclera: Conjunctivae normal.     Pupils: Pupils are equal, round, and reactive to light.     Funduscopic exam:    Right eye: Red reflex present.        Left eye: Red reflex present. Neck:     Thyroid: No thyromegaly.      Vascular: No JVD.     Trachea: Trachea and phonation normal. No tracheal deviation.  Cardiovascular:     Rate and Rhythm: Normal rate and regular rhythm.     Pulses: Normal pulses.     Heart sounds: Normal heart sounds, S1 normal and S2 normal. No murmur heard.   No friction rub. No gallop.  Pulmonary:     Effort: Pulmonary effort is normal. No accessory muscle usage or respiratory distress.     Breath sounds:  Normal breath sounds and air entry. No stridor. No wheezing or rales.  Chest:     Chest wall: No tenderness.     Comments: Declined clinical breast exam.  Abdominal:     General: Bowel sounds are normal. There is no distension.     Palpations: Abdomen is soft. There is no mass.     Tenderness: There is no abdominal tenderness. There is no guarding or rebound.     Hernia: There is no hernia in the left inguinal area or right inguinal area.  Genitourinary:    General: Normal vulva.     Exam position: Lithotomy position.     Labia:        Right: No rash, tenderness, lesion or injury.        Left: No rash, tenderness, lesion or injury.      Urethra: No prolapse, urethral swelling or urethral lesion.     Vagina: Normal. No signs of injury and foreign body. No vaginal discharge, erythema, tenderness, bleeding, lesions or prolapsed vaginal walls.     Cervix: No cervical motion tenderness, discharge, friability, lesion, erythema, cervical bleeding or eversion.     Uterus: Normal. Not deviated, not enlarged, not fixed, not tender and no uterine prolapse.      Adnexa: Right adnexa normal and left adnexa normal.       Right: No mass, tenderness or fullness.         Left: No mass, tenderness or fullness.       Rectum: Normal. No anal fissure or external hemorrhoid. Normal anal tone.  Musculoskeletal:        General: No tenderness or deformity. Normal range of motion.     Cervical back: Normal range of motion and neck supple.     Right lower leg: No edema.     Left lower leg: No edema.   Lymphadenopathy:     Cervical: No cervical adenopathy.     Lower Body: No right inguinal adenopathy. No left inguinal adenopathy.  Skin:    General: Skin is warm and dry.     Capillary Refill: Capillary refill takes less than 2 seconds.     Coloration: Skin is not pale.     Findings: No erythema or rash.  Neurological:     Mental Status: She is alert and oriented to person, place, and time.     Cranial Nerves: No cranial nerve deficit.     Motor: No abnormal muscle tone.     Coordination: Coordination normal.     Deep Tendon Reflexes: Reflexes are normal and symmetric.  Psychiatric:        Mood and Affect: Mood and affect normal.        Behavior: Behavior normal. Behavior is cooperative.        Thought Content: Thought content normal.        Judgment: Judgment normal.       Assessment/Plan: 1. Encounter for general adult medical examination with abnormal findings Age-appropriate preventive screenings and vaccinations discussed, annual physical exam completed. Routine labs for health maintenance ordered, see below. PHM updated.  - CMP14+EGFR  2. Hair loss Minoxidil prescribed to treat the hair loss. Labs ordered.  - minoxidil (LONITEN) 10 MG tablet; Take 1 tablet (10 mg total) by mouth daily.  Dispense: 30 tablet; Refill: 2 - CMP14+EGFR - TSH + free T4  3. Bilateral hearing loss due to cerumen impaction Ear lavage done in office. TM wnl bilaterally. - Ear Lavage  4. Iron deficiency anemia, unspecified  iron deficiency anemia type Routine labs ordered - CBC with Differential/Platelet - CMP14+EGFR - Iron, TIBC and Ferritin Panel  5. Vitamin D deficiency Routine labs ordered - CMP14+EGFR - Vitamin D (25 hydroxy)  6. Mixed hyperlipidemia Routine labs ordered - CMP14+EGFR - Lipid Profile  7. B12 deficiency Routine labs ordered - CMP14+EGFR - B12 and Folate Panel  8. Encounter for screening for malignant neoplasm of cervix Routine pap done  - IGP, Aptima  HPV  9. Encounter for screening for infections with a predominantly sexual mode of transmission Routine cervical swab done.  - NuSwab Vaginitis Plus (VG+)  10. Dysuria Routine urinalysis done.  - UA/M w/rflx Culture, Routine - CMP14+EGFR - Microscopic Examination  11. Need for vaccination - Zoster Vaccine Adjuvanted [facility]) injection; Inject 0.5 mLs into the muscle once for 1 dose.  Dispense: 0.5 mL; Refill: 0      General Counseling: Shanen verbalizes understanding of the findings of todays visit and agrees with plan of treatment. I have discussed any further diagnostic evaluation that may be needed or ordered today. We also reviewed her medications today. she has been encouraged to call the office with any questions or concerns that should arise related to todays visit.    Orders Placed This Encounter  Procedures   Microscopic Examination   UA/M w/rflx Culture, Routine   NuSwab Vaginitis Plus (VG+)   CBC with Differential/Platelet   CMP14+EGFR   Vitamin D (25 hydroxy)   TSH + free T4   Lipid Profile   B12 and Folate Panel   Iron, TIBC and Ferritin Panel   Ear Lavage    Meds ordered this encounter  Medications   Zoster Vaccine Adjuvanted [facility]) injection    Sig: Inject 0.5 mLs into the muscle once for 1 dose.    Dispense:  0.5 mL    Refill:  0   minoxidil (LONITEN) 10 MG tablet    Sig: Take 1 tablet (10 mg total) by mouth daily.    Dispense:  30 tablet    Refill:  2    Return in about 1 year (around 11/28/2022) for CPE, [place] PCP.   Total time spent:30 Minutes Time spent includes review of chart, medications, test results, and follow up plan with the patient.   [place] Controlled Substance Database was reviewed by me.  This patient was seen by Jonetta Osgood, FNP-C in collaboration with Dr. Clayborn Bigness as a part of collaborative care agreement.  Jazzie Trampe R. Valetta Fuller, MSN, FNP-C Internal medicine

## 2021-11-29 LAB — UA/M W/RFLX CULTURE, ROUTINE
Bilirubin, UA: NEGATIVE
Glucose, UA: NEGATIVE
Ketones, UA: NEGATIVE
Leukocytes,UA: NEGATIVE
Nitrite, UA: NEGATIVE
Protein,UA: NEGATIVE
RBC, UA: NEGATIVE
Specific Gravity, UA: 1.011 (ref 1.005–1.030)
Urobilinogen, Ur: 0.2 mg/dL (ref 0.2–1.0)
pH, UA: 6 (ref 5.0–7.5)

## 2021-11-29 LAB — MICROSCOPIC EXAMINATION
Bacteria, UA: NONE SEEN
Casts: NONE SEEN /lpf
Epithelial Cells (non renal): NONE SEEN /hpf (ref 0–10)
RBC, Urine: NONE SEEN /hpf (ref 0–2)
WBC, UA: NONE SEEN /hpf (ref 0–5)

## 2021-11-30 LAB — NUSWAB VAGINITIS PLUS (VG+)
Candida albicans, NAA: NEGATIVE
Candida glabrata, NAA: NEGATIVE
Chlamydia trachomatis, NAA: NEGATIVE
Neisseria gonorrhoeae, NAA: NEGATIVE
Trich vag by NAA: NEGATIVE

## 2021-12-01 ENCOUNTER — Encounter: Payer: Self-pay | Admitting: Nurse Practitioner

## 2021-12-02 DIAGNOSIS — E538 Deficiency of other specified B group vitamins: Secondary | ICD-10-CM | POA: Diagnosis not present

## 2021-12-02 DIAGNOSIS — E782 Mixed hyperlipidemia: Secondary | ICD-10-CM | POA: Diagnosis not present

## 2021-12-02 DIAGNOSIS — Z124 Encounter for screening for malignant neoplasm of cervix: Secondary | ICD-10-CM | POA: Diagnosis not present

## 2021-12-02 DIAGNOSIS — L659 Nonscarring hair loss, unspecified: Secondary | ICD-10-CM | POA: Diagnosis not present

## 2021-12-02 DIAGNOSIS — E559 Vitamin D deficiency, unspecified: Secondary | ICD-10-CM | POA: Diagnosis not present

## 2021-12-02 DIAGNOSIS — D509 Iron deficiency anemia, unspecified: Secondary | ICD-10-CM | POA: Diagnosis not present

## 2021-12-02 DIAGNOSIS — Z113 Encounter for screening for infections with a predominantly sexual mode of transmission: Secondary | ICD-10-CM | POA: Diagnosis not present

## 2021-12-02 DIAGNOSIS — R300 Dysuria: Secondary | ICD-10-CM | POA: Diagnosis not present

## 2021-12-03 LAB — IGP, APTIMA HPV: HPV Aptima: NEGATIVE

## 2021-12-05 LAB — TSH+FREE T4
Free T4: 1.21 ng/dL (ref 0.82–1.77)
TSH: 0.635 u[IU]/mL (ref 0.450–4.500)

## 2021-12-05 LAB — CBC WITH DIFFERENTIAL/PLATELET
Basophils Absolute: 0.1 10*3/uL (ref 0.0–0.2)
Basos: 1 %
EOS (ABSOLUTE): 0.1 10*3/uL (ref 0.0–0.4)
Eos: 2 %
Hematocrit: 37.6 % (ref 34.0–46.6)
Hemoglobin: 12.3 g/dL (ref 11.1–15.9)
Immature Grans (Abs): 0 10*3/uL (ref 0.0–0.1)
Immature Granulocytes: 0 %
Lymphocytes Absolute: 2.2 10*3/uL (ref 0.7–3.1)
Lymphs: 40 %
MCH: 29.9 pg (ref 26.6–33.0)
MCHC: 32.7 g/dL (ref 31.5–35.7)
MCV: 91 fL (ref 79–97)
Monocytes Absolute: 0.5 10*3/uL (ref 0.1–0.9)
Monocytes: 9 %
Neutrophils Absolute: 2.7 10*3/uL (ref 1.4–7.0)
Neutrophils: 48 %
Platelets: 219 10*3/uL (ref 150–450)
RBC: 4.12 x10E6/uL (ref 3.77–5.28)
RDW: 13.1 % (ref 11.7–15.4)
WBC: 5.5 10*3/uL (ref 3.4–10.8)

## 2021-12-05 LAB — CMP14+EGFR
ALT: 17 IU/L (ref 0–32)
AST: 21 IU/L (ref 0–40)
Albumin/Globulin Ratio: 1.5 (ref 1.2–2.2)
Albumin: 4.4 g/dL (ref 3.8–4.9)
Alkaline Phosphatase: 65 IU/L (ref 44–121)
BUN/Creatinine Ratio: 21 (ref 9–23)
BUN: 12 mg/dL (ref 6–24)
Bilirubin Total: 0.3 mg/dL (ref 0.0–1.2)
CO2: 25 mmol/L (ref 20–29)
Calcium: 9.4 mg/dL (ref 8.7–10.2)
Chloride: 100 mmol/L (ref 96–106)
Creatinine, Ser: 0.56 mg/dL — ABNORMAL LOW (ref 0.57–1.00)
Globulin, Total: 2.9 g/dL (ref 1.5–4.5)
Glucose: 91 mg/dL (ref 70–99)
Potassium: 3.9 mmol/L (ref 3.5–5.2)
Sodium: 139 mmol/L (ref 134–144)
Total Protein: 7.3 g/dL (ref 6.0–8.5)
eGFR: 110 mL/min/{1.73_m2} (ref >59)

## 2021-12-05 LAB — IRON,TIBC AND FERRITIN PANEL
Ferritin: 63 ng/mL (ref 15–150)
Iron Saturation: 33 % (ref 15–55)
Iron: 111 ug/dL (ref 27–159)
Total Iron Binding Capacity: 338 ug/dL (ref 250–450)
UIBC: 227 ug/dL (ref 131–425)

## 2021-12-05 LAB — LIPID PANEL
Chol/HDL Ratio: 3 ratio (ref 0.0–4.4)
Cholesterol, Total: 185 mg/dL (ref 100–199)
HDL: 61 mg/dL (ref >39)
LDL Chol Calc (NIH): 110 mg/dL — ABNORMAL HIGH (ref 0–99)
Triglycerides: 76 mg/dL (ref 0–149)
VLDL Cholesterol Cal: 14 mg/dL (ref 5–40)

## 2021-12-05 LAB — VITAMIN D 25 HYDROXY (VIT D DEFICIENCY, FRACTURES): Vit D, 25-Hydroxy: 34.6 ng/mL (ref 30.0–100.0)

## 2021-12-05 LAB — B12 AND FOLATE PANEL
Folate: >20 ng/mL (ref >3.0)
Vitamin B-12: 909 pg/mL (ref 232–1245)

## 2021-12-13 DIAGNOSIS — F4310 Post-traumatic stress disorder, unspecified: Secondary | ICD-10-CM | POA: Diagnosis not present

## 2022-01-01 ENCOUNTER — Ambulatory Visit
Admission: RE | Admit: 2022-01-01 | Discharge: 2022-01-01 | Disposition: A | Discharge status: Home / Self Care | Payer: BC Managed Care – PPO | Source: Ambulatory Visit | Attending: Internal Medicine | Admitting: Internal Medicine

## 2022-01-01 ENCOUNTER — Telehealth: Payer: Self-pay

## 2022-01-01 ENCOUNTER — Other Ambulatory Visit: Payer: Self-pay

## 2022-01-01 DIAGNOSIS — F4310 Post-traumatic stress disorder, unspecified: Secondary | ICD-10-CM | POA: Diagnosis not present

## 2022-01-01 DIAGNOSIS — Z1231 Encounter for screening mammogram for malignant neoplasm of breast: Secondary | ICD-10-CM | POA: Diagnosis not present

## 2022-01-01 NOTE — Telephone Encounter (Signed)
Spoke with patient regarding lab results on 01/01/2022. ?

## 2022-01-01 NOTE — Telephone Encounter (Signed)
-----   Message from Jonetta Osgood, NP sent at 01/01/2022 11:01 AM EDT ----- ?Please call patient with results: ?--metabolic panel is grossly normal ?--cholesterol levels are normal except for slightly elevated LDL. Limit red meat intake, increase lean proteins.  ?--CBC is normal ?--vitamin D is slow normal, recommend OTC vitamin D supplement at least 2000 units daily to keep levels within normal limits.  ?--thyroid levels are normal ?--B12 and folate are normal ?--iron and ferritin panel are normal ?--pap smear was unsatisfactory, guideline recommendations are to repeat pap in 3 months. Please have her schedule an appt for repeat pap smear. Was negative for HPV.  ?--negative for BV, yeast, and STDs ?

## 2022-01-02 ENCOUNTER — Other Ambulatory Visit: Payer: Self-pay | Admitting: Internal Medicine

## 2022-01-02 DIAGNOSIS — R928 Other abnormal and inconclusive findings on diagnostic imaging of breast: Secondary | ICD-10-CM

## 2022-01-06 ENCOUNTER — Ambulatory Visit: Payer: BC Managed Care – PPO | Admitting: Nurse Practitioner

## 2022-01-06 ENCOUNTER — Other Ambulatory Visit: Payer: Self-pay | Admitting: Nurse Practitioner

## 2022-01-06 DIAGNOSIS — R928 Other abnormal and inconclusive findings on diagnostic imaging of breast: Secondary | ICD-10-CM

## 2022-01-06 NOTE — Progress Notes (Signed)
Pt needs additional views, sign the order as indicated

## 2022-01-09 ENCOUNTER — Encounter: Payer: Self-pay | Admitting: Nurse Practitioner

## 2022-01-09 ENCOUNTER — Ambulatory Visit: Payer: BC Managed Care – PPO | Admitting: Nurse Practitioner

## 2022-01-09 VITALS — BP 102/70 | HR 91 | Temp 98.4°F | Resp 16 | Ht 64.0 in | Wt 116.4 lb

## 2022-01-09 DIAGNOSIS — Z124 Encounter for screening for malignant neoplasm of cervix: Secondary | ICD-10-CM

## 2022-01-09 DIAGNOSIS — E559 Vitamin D deficiency, unspecified: Secondary | ICD-10-CM | POA: Diagnosis not present

## 2022-01-09 DIAGNOSIS — R87615 Unsatisfactory cytologic smear of cervix: Secondary | ICD-10-CM | POA: Diagnosis not present

## 2022-01-09 DIAGNOSIS — E782 Mixed hyperlipidemia: Secondary | ICD-10-CM | POA: Diagnosis not present

## 2022-01-09 DIAGNOSIS — R102 Pelvic and perineal pain: Secondary | ICD-10-CM | POA: Diagnosis not present

## 2022-01-09 NOTE — Progress Notes (Addendum)
[facility] ?[address] ?[place], [place] [address] ? ?Internal MEDICINE  ?Office Visit Note ? ?Patient Name: Sydney French ? [id number]  ?[id number] ? ?Date of Service: 01/09/2022 ? ?Chief Complaint  ?Patient presents with  ? Follow-up  ?  Left side pain and blood work   ? Results  ?  Mammo and additional view [place]   ? ? ?HPI ?Sydney French presents for a follow up visit for a repeat pap smear. Her pap done in February was unsatisfactory and the recommendation from the [facility] is to repeat the pap smear. Her pelvic exam was normal in February and there was nothing concerning found.  ?She has been having some right-sided pelvic pain off and on for the past couple of months as well and is concerned about this pain.  ?She had her mammogram recently which was scored as BI-RADS 0 incomplete and additional views have been ordered ---- diagnostic mammo and right sided ultrasound. There was an obscured area on the right side that requires additional imaging.  ?--discussed recent lab results with patient: ?---iron panel is normal ?---B12 and folate are normal ?---thyroid levels are normal ?---CBC is normal ?---metabolic panel is grossly normal ?---vitamin D is low normal  ?---lipid panel is normal except for a slightly elevated LDL ? ? ? ? ? ?Current Medication: ?Outpatient Encounter Medications as of 01/09/2022  ?Medication Sig  ? diphenhydrAMINE HCl (BENADRYL ALLERGY PO) Take by mouth.  ? ergocalciferol (DRISDOL) 1.25 MG (50000 UT) capsule Take 1 capsule (50,000 Units total) by mouth once a week.  ? etodolac (LODINE) 500 MG tablet Take 1 tablet (500 mg total) by mouth 2 (two) times daily.  ? Ferrous Fumarate (HEMOCYTE) 324 (106 Fe) MG TABS tablet Take 1 tablet (106 mg of iron total) by mouth daily.  ? ibuprofen (ADVIL) 200 MG tablet Take 200 mg by mouth every 6 (six) hours as needed.  ? minoxidil (LONITEN) 10 MG tablet Take 1 tablet (10 mg total) by mouth daily.  ? tretinoin (RETIN-A) 0.025 % cream Apply topically at  bedtime.  ? ?No facility-administered encounter medications on file as of 01/09/2022.  ? ? ?Surgical History: ?Past Surgical History:  ?Procedure Laterality Date  ? CESAREAN SECTION    ? child birth natural    ? 1995, 1998, 2003  ? CHOLECYSTECTOMY  1993  ? DILATION AND CURETTAGE OF UTERUS    ? GALLBLADDER SURGERY    ? ? ?Medical History: ?Past Medical History:  ?Diagnosis Date  ? Endometriosis   ? History of ovarian cyst   ? Irregular periods   ? Osteoarthritis   ? ? ?Family History: ?Family History  ?Problem Relation Age of Onset  ? Melanoma Mother   ? Cancer Mother   ? Heart attack Father   ? Melanoma Brother   ? Asthma Neg Hx   ? Breast cancer Neg Hx   ? Stroke Neg Hx   ? Glaucoma Neg Hx   ? Arthritis Neg Hx   ? ? ?Social History  ? ?Socioeconomic History  ? Marital status: Married  ?  Spouse name: Not on file  ? Number of children: Not on file  ? Years of education: Not on file  ? Highest education level: Not on file  ?Occupational History  ? Not on file  ?Tobacco Use  ? Smoking status: Never  ? Smokeless tobacco: Never  ?Vaping Use  ? Vaping Use: Not on file  ?Substance and Sexual Activity  ? Alcohol use: No  ?  Drug use: No  ? Sexual activity: Yes  ?Other Topics Concern  ? Not on file  ?Social History Narrative  ? Not on file  ? ?Social Determinants of Health  ? ?Financial Resource Strain: Not on file  ?Food Insecurity: Not on file  ?Transportation Needs: Not on file  ?Physical Activity: Not on file  ?Stress: Not on file  ?Social Connections: Not on file  ?Intimate Partner Violence: Not on file  ? ? ? ? ?Review of Systems  ?Constitutional:  Negative for chills, fatigue and unexpected weight change.  ?HENT:  Negative for congestion, rhinorrhea, sneezing and sore throat.   ?Eyes:  Negative for redness.  ?Respiratory:  Negative for cough, chest tightness and shortness of breath.   ?Cardiovascular:  Negative for chest pain and palpitations.  ?Gastrointestinal:  Negative for abdominal pain, constipation, diarrhea,  nausea and vomiting.  ?Genitourinary:  Positive for pelvic pain (right sided, intermittent). Negative for dysuria and frequency.  ?Musculoskeletal:  Negative for arthralgias, back pain, joint swelling and neck pain.  ?Skin:  Negative for rash.  ?Neurological: Negative.  Negative for tremors and numbness.  ?Hematological:  Negative for adenopathy. Does not bruise/bleed easily.  ?Psychiatric/Behavioral:  Negative for behavioral problems (Depression), sleep disturbance and suicidal ideas. The patient is not nervous/anxious.   ? ?Vital Signs: ?BP 102/70   Pulse 91   Temp 98.4 ?F (36.9 ?C)   Resp 16   Ht '5\' 4"'$  (1.626 m)   Wt 116 lb 6.4 oz (52.8 kg)   SpO2 99%   BMI 19.98 kg/m?  ? ? ?Physical Exam ?Vitals reviewed.  ?Constitutional:   ?   General: She is not in acute distress. ?   Appearance: Normal appearance. She is normal weight. She is not ill-appearing.  ?HENT:  ?   Head: Normocephalic and atraumatic.  ?Eyes:  ?   Pupils: Pupils are equal, round, and reactive to light.  ?Cardiovascular:  ?   Rate and Rhythm: Normal rate and regular rhythm.  ?Pulmonary:  ?   Effort: Pulmonary effort is normal. No respiratory distress.  ?Abdominal:  ?   Hernia: There is no hernia in the left inguinal area or right inguinal area.  ?Genitourinary: ?   General: Normal vulva.  ?   Exam position: Lithotomy position.  ?   Pubic Area: No rash or pubic lice.   ?   Labia:     ?   Right: No rash, tenderness, lesion or injury.     ?   Left: No rash, tenderness, lesion or injury.   ?   Urethra: No prolapse, urethral swelling or urethral lesion.  ?   Vagina: Normal. No signs of injury and foreign body. No vaginal discharge, erythema, tenderness, bleeding, lesions or prolapsed vaginal walls.  ?   Cervix: No cervical motion tenderness, discharge, friability, lesion, erythema, cervical bleeding or eversion.  ?   Uterus: Normal. Not deviated, not enlarged, not fixed and no uterine prolapse.   ?   Adnexa: Right adnexa normal and left adnexa  normal.    ?   Right: No mass, tenderness or fullness.      ?   Left: No mass, tenderness or fullness.    ?   Rectum: Normal. No anal fissure or external hemorrhoid.  ?   Comments: Cervix was normal but anteriorly positioned.  ?Lymphadenopathy:  ?   Lower Body: No right inguinal adenopathy. No left inguinal adenopathy.  ?Neurological:  ?   Mental Status: She is alert and oriented to  person, place, and time.  ?Psychiatric:     ?   Mood and Affect: Mood normal.     ?   Behavior: Behavior normal.  ? ? ? ? ? ?Assessment/Plan: ?1. Encounter for repeat Papanicolaou smear of cervix due to previous unsatisfactory results ?Repeat pap done, pelvic exam was normal once again. Will call patient with pap results.  ?- IGP, Aptima HPV ? ?2. Pelvic pain in female ?right-sided pelvic pain that comes and goes. No significant findings on physical exam to identify a possible cause, ultrasound ordered.  ?- US PELVIS (TRANSABDOMINAL ONLY); Future ? ?3. Mixed hyperlipidemia ?LDL is elevated, discussed appropriate diet and lifestyle modifications. Start omega3 fish oil supplement also suggested as a possible intervention.  ? ?4. Vitamin D deficiency ?Vitamin D level has improved but is low normal, recommended continuing an OTC vitamin D supplement at least.  ? ? ?General Counseling: chance karam understanding of the findings of todays visit and agrees with plan of treatment. I have discussed any further diagnostic evaluation that may be needed or ordered today. We also reviewed her medications today. she has been encouraged to call the office with any questions or concerns that should arise related to todays visit. ? ? ? ?Orders Placed This Encounter  ?Procedures  ? US PELVIS (TRANSABDOMINAL ONLY)  ? ? ?No orders of the defined types were placed in this encounter. ? ? ?Return for F/U, U/S @ [facility]. ? ? ?Total time spent:30 Minutes ?Time spent includes review of chart, medications, test results, and follow up plan with the  patient.  ? ?[place] Controlled Substance Database was reviewed by me. ? ?This patient was seen by Jonetta Osgood, FNP-C in collaboration with Dr. Clayborn Bigness as a part of collaborative care agreement. ? ? ?Rivky Clendenning R. Morrisa Aldaba

## 2022-01-13 ENCOUNTER — Encounter: Payer: Self-pay | Admitting: Nurse Practitioner

## 2022-01-13 ENCOUNTER — Telehealth: Payer: Self-pay

## 2022-01-13 NOTE — Telephone Encounter (Signed)
Spoke to pt, informed her note correction has been done ?

## 2022-01-15 DIAGNOSIS — F4310 Post-traumatic stress disorder, unspecified: Secondary | ICD-10-CM | POA: Diagnosis not present

## 2022-01-15 LAB — IGP, APTIMA HPV: HPV Aptima: NEGATIVE

## 2022-01-20 ENCOUNTER — Other Ambulatory Visit: Payer: Self-pay | Admitting: Nurse Practitioner

## 2022-01-20 DIAGNOSIS — R87615 Unsatisfactory cytologic smear of cervix: Secondary | ICD-10-CM

## 2022-01-20 NOTE — Progress Notes (Signed)
Please call patient and let her know that her repeat pap smear resulted as unsatisfactory but negative for HPV. I have already ordered a referral to gynecology for further evaluation. When there is 2 unsatisfactory results on pap in a row after previously normal pap smears, the recommendation is usually a biopsy obtained through a procedure called a colposcopy which much be performed by a specialist.

## 2022-01-21 ENCOUNTER — Ambulatory Visit
Admission: RE | Admit: 2022-01-21 | Discharge: 2022-01-21 | Disposition: A | Discharge status: Home / Self Care | Payer: BC Managed Care – PPO | Source: Ambulatory Visit | Attending: Internal Medicine | Admitting: Internal Medicine

## 2022-01-21 ENCOUNTER — Telehealth: Payer: Self-pay

## 2022-01-21 DIAGNOSIS — N6489 Other specified disorders of breast: Secondary | ICD-10-CM | POA: Diagnosis not present

## 2022-01-21 DIAGNOSIS — R928 Other abnormal and inconclusive findings on diagnostic imaging of breast: Secondary | ICD-10-CM | POA: Diagnosis not present

## 2022-01-21 DIAGNOSIS — R922 Inconclusive mammogram: Secondary | ICD-10-CM | POA: Diagnosis not present

## 2022-01-21 NOTE — Telephone Encounter (Signed)
Spoke to pt, provided results, informed her of procedure that is recommended in her situation.  ?

## 2022-01-21 NOTE — Telephone Encounter (Signed)
-----   Message from Jonetta Osgood, NP sent at 01/20/2022 10:56 PM EDT ----- ?Please call patient and let her know that her repeat pap smear resulted as unsatisfactory but negative for HPV. I have already ordered a referral to gynecology for further evaluation. When there is 2 unsatisfactory results on pap in a row after previously normal pap smears, the recommendation is usually a biopsy obtained through a procedure called a colposcopy which much be performed by a specialist.  ?

## 2022-01-22 ENCOUNTER — Telehealth: Payer: Self-pay

## 2022-01-22 NOTE — Telephone Encounter (Signed)
[facility] referring for [employer]. Sch with [facility]. Called and left voicemail for patient to call back to be scheduled.

## 2022-01-22 NOTE — Telephone Encounter (Signed)
-----   Message from Excell Seltzer, RN sent at 01/21/2022  6:46 PM EDT ----- Regarding: referral Schedule with [facility] for possible colpo in 4-6 weeks.

## 2022-01-24 ENCOUNTER — Other Ambulatory Visit: Payer: BC Managed Care – PPO

## 2022-01-27 NOTE — Telephone Encounter (Signed)
Contacted patient via phone. Patient states she is scheduled with [facility] for 02/21/22 and wants to see them because she has already established care.

## 2022-02-03 ENCOUNTER — Ambulatory Visit (INDEPENDENT_AMBULATORY_CARE_PROVIDER_SITE_OTHER): Payer: BC Managed Care – PPO

## 2022-02-03 DIAGNOSIS — R102 Pelvic and perineal pain: Secondary | ICD-10-CM | POA: Diagnosis not present

## 2022-02-06 DIAGNOSIS — F4310 Post-traumatic stress disorder, unspecified: Secondary | ICD-10-CM | POA: Diagnosis not present

## 2022-02-12 ENCOUNTER — Encounter: Payer: Self-pay | Admitting: Nurse Practitioner

## 2022-02-12 ENCOUNTER — Ambulatory Visit: Payer: BC Managed Care – PPO | Admitting: Nurse Practitioner

## 2022-02-12 VITALS — BP 129/77 | HR 86 | Temp 98.6°F | Resp 16 | Ht 64.0 in | Wt 114.8 lb

## 2022-02-12 DIAGNOSIS — R1030 Lower abdominal pain, unspecified: Secondary | ICD-10-CM

## 2022-02-12 DIAGNOSIS — R87615 Unsatisfactory cytologic smear of cervix: Secondary | ICD-10-CM | POA: Diagnosis not present

## 2022-02-12 DIAGNOSIS — R102 Pelvic and perineal pain: Secondary | ICD-10-CM | POA: Diagnosis not present

## 2022-02-12 DIAGNOSIS — R1011 Right upper quadrant pain: Secondary | ICD-10-CM

## 2022-02-12 NOTE — Progress Notes (Signed)
[facility] [place], [place] [address]  Internal MEDICINE  Office Visit Note  Patient Name: Sydney French  [id number]  [id number]  Date of Service: 02/12/2022  Chief Complaint  Patient presents with   Follow-up    Some discomfort in pelvic most on the right little on the left side   Results    [place] and pap    HPI Sydney French presents for follow-up visit to discuss ultrasound results and her previous Pap smear.  Patient has had 2 unsatisfactory result in a row on her Pap smears and both were HPV negative.  Recommendation is referral for colposcopy which was done.  She continues to have pelvic pain predominantly on the right side.  The transabdominal pelvic ultrasound did not show any abnormalities that could explain her lower abdominal/pelvic pain.  The fibroids that she reports that she had previously are not seen on the ultrasound and it is a limited exam so further evaluation will be needed.  It is possible that the source of the pain is muscular or gastrointestinal in nature.    Current Medication: Outpatient Encounter Medications as of 02/12/2022  Medication Sig   diphenhydrAMINE HCl (BENADRYL ALLERGY PO) Take by mouth.   ergocalciferol (DRISDOL) 1.25 MG (50000 UT) capsule Take 1 capsule (50,000 Units total) by mouth once a week.   etodolac (LODINE) 500 MG tablet Take 1 tablet (500 mg total) by mouth 2 (two) times daily.   Ferrous Fumarate (HEMOCYTE) 324 (106 Fe) MG TABS tablet Take 1 tablet (106 mg of iron total) by mouth daily.   ibuprofen (ADVIL) 200 MG tablet Take 200 mg by mouth every 6 (six) hours as needed.   tretinoin (RETIN-A) 0.025 % cream Apply topically at bedtime.   [DISCONTINUED] minoxidil (LONITEN) 10 MG tablet Take 1 tablet (10 mg total) by mouth daily.   No facility-administered encounter medications on file as of 02/12/2022.    Surgical History: Past Surgical History:  Procedure Laterality Date   CESAREAN SECTION     child birth natural      1995, 1998, 2003   CHOLECYSTECTOMY  39   DILATION AND CURETTAGE OF UTERUS     GALLBLADDER SURGERY      Medical History: Past Medical History:  Diagnosis Date   Endometriosis    History of ovarian cyst    Irregular periods    Osteoarthritis     Family History: Family History  Problem Relation Age of Onset   Melanoma Mother    Cancer Mother    Heart attack Father    Melanoma Brother    Asthma Neg Hx    Breast cancer Neg Hx    Stroke Neg Hx    Glaucoma Neg Hx    Arthritis Neg Hx     Social History   Socioeconomic History   Marital status: Married    Spouse name: Not on file   Number of children: Not on file   Years of education: Not on file   Highest education level: Not on file  Occupational History   Not on file  Tobacco Use   Smoking status: Never   Smokeless tobacco: Never  Vaping Use   Vaping Use: Not on file  Substance and Sexual Activity   Alcohol use: No   Drug use: No   Sexual activity: Yes  Other Topics Concern   Not on file  Social History Narrative   Not on file   Social Determinants of Health   Financial Resource  Strain: Not on file  Food Insecurity: Not on file  Transportation Needs: Not on file  Physical Activity: Not on file  Stress: Not on file  Social Connections: Not on file  Intimate Partner Violence: Not on file      Review of Systems  Constitutional:  Negative for chills, fatigue and fever.  Respiratory:  Negative for cough, chest tightness, shortness of breath and wheezing.   Cardiovascular: Negative.  Negative for chest pain and palpitations.  Gastrointestinal:  Positive for abdominal pain (lower). Negative for constipation, diarrhea, nausea and vomiting.  Genitourinary:  Positive for pelvic pain. Negative for dysuria.  Musculoskeletal: Negative.  Negative for arthralgias and myalgias.  Skin:  Negative for rash.  Psychiatric/Behavioral:  The patient is nervous/anxious.     Vital Signs: BP 129/77   Pulse 86    Temp 98.6 F (37 C)   Resp 16   Ht '5\' 4"'$  (1.626 m)   Wt 114 lb 12.8 oz (52.1 kg)   SpO2 98%   BMI 19.71 kg/m    Physical Exam Vitals reviewed.  Constitutional:      General: She is not in acute distress.    Appearance: Normal appearance. She is normal weight. She is not ill-appearing.  HENT:     Head: Normocephalic and atraumatic.  Eyes:     Pupils: Pupils are equal, round, and reactive to light.  Cardiovascular:     Rate and Rhythm: Normal rate and regular rhythm.  Pulmonary:     Effort: Pulmonary effort is normal. No respiratory distress.  Abdominal:     General: Bowel sounds are normal.     Palpations: Abdomen is soft. There is no shifting dullness, fluid wave, mass or pulsatile mass.     Tenderness: There is abdominal tenderness (colicky RUQ pain) in the right upper quadrant, right lower quadrant and left lower quadrant. There is guarding. There is no right CVA tenderness, left CVA tenderness or rebound.  Neurological:     Mental Status: She is alert and oriented to person, place, and time.  Psychiatric:        Mood and Affect: Mood normal.        Behavior: Behavior normal.        Assessment/Plan: 1. Colicky RUQ abdominal pain Refer to GI - Ambulatory referral to Gastroenterology  2. Lower abdominal pain Refer to GI - Ambulatory referral to Gastroenterology  3. Pelvic pain in female Continues, may not be gynecologic in nature, rule out GI causes  4. Unsatisfactory cytology of cervical Papanicolaou smear Has upcoming visit at [facility] for [facility]   General Counseling: Sydney French understanding of the findings of todays visit and agrees with plan of treatment. I have discussed any further diagnostic evaluation that may be needed or ordered today. We also reviewed her medications today. she has been encouraged to call the office with any questions or concerns that should arise related to todays visit.    Orders Placed This Encounter   Procedures   Ambulatory referral to Gastroenterology    No orders of the defined types were placed in this encounter.   No follow-ups on file.   Total time spent:30 Minutes Time spent includes review of chart, medications, test results, and follow up plan with the patient.   [place] Controlled Substance Database was reviewed by me.  This patient was seen by Jonetta Osgood, FNP-C in collaboration with Dr. Clayborn Bigness as a part of collaborative care agreement.   Jadie Allington R. Valetta Fuller, MSN, FNP-C Internal  medicine

## 2022-02-18 NOTE — Progress Notes (Signed)
   [facility] [address] [place], [place]  [address] Phone:  [phone]   Fax:  [phone]  Subjective:     Sydney French is a 51 y.o. woman who comes in today for a colposcopy. Her most recent annual exam was on 11/28/2021. Her most recent Pap smear showed:Unsatisfactory pap, HPV neg x 2 (had repeat on 01/09/22 showing same results). Previous abnormal Pap smears: no. Contraception: post menopausal status  The following portions of the patient's history were reviewed and updated as appropriate: She  has a past medical history of Endometriosis, History of ovarian cyst, Irregular periods, and Osteoarthritis. She  has a past surgical history that includes child birth natural; Cholecystectomy (1993); Cesarean section; Gallbladder surgery; and Dilation and curettage of uterus. Her family history includes Cancer in her mother; Heart attack in her father; Melanoma in her brother and mother. She  reports that she has never smoked. She has never used smokeless tobacco. She reports that she does not drink alcohol and does not use drugs. Current Outpatient Medications on File Prior to Visit  Medication Sig Dispense Refill   diphenhydrAMINE HCl (BENADRYL ALLERGY PO) Take by mouth.     ergocalciferol (DRISDOL) 1.25 MG (50000 UT) capsule Take 1 capsule (50,000 Units total) by mouth once a week. 4 capsule 5   etodolac (LODINE) 500 MG tablet Take 1 tablet (500 mg total) by mouth 2 (two) times daily. 45 tablet 2   Ferrous Fumarate (HEMOCYTE) 324 (106 Fe) MG TABS tablet Take 1 tablet (106 mg of iron total) by mouth daily. 30 tablet 5   ibuprofen (ADVIL) 200 MG tablet Take 200 mg by mouth every 6 (six) hours as needed.     minoxidil (LONITEN) 10 MG tablet Take 1 tablet (10 mg total) by mouth daily. 30 tablet 2   tretinoin (RETIN-A) 0.025 % cream Apply topically at bedtime. 45 g 3   No current facility-administered medications on file prior to visit.   She has No Known  Allergies..  Review of Systems A comprehensive review of systems was negative.   Objective:    BP 102/64   Pulse 90   Resp 16   Ht '5\' 4"'$  (1.626 m)   Wt 114 lb 1.6 oz (51.8 kg)   BMI 19.59 kg/m  Pelvic Exam: cervix normal in appearance, external genitalia normal, and vagina normal without discharge. Pap smear obtained.   Assessment:   1. Unsatisfactory cervical Papanicolaou smear    Plan:   Pap smear repeated today using pap broom and spatula. Can follow up ias needed, or as indicated by Pap results.   Rubie Maid, MD [facility]

## 2022-02-19 ENCOUNTER — Other Ambulatory Visit: Payer: BC Managed Care – PPO

## 2022-02-21 ENCOUNTER — Encounter: Payer: Self-pay | Admitting: Obstetrics and Gynecology

## 2022-02-21 ENCOUNTER — Ambulatory Visit (INDEPENDENT_AMBULATORY_CARE_PROVIDER_SITE_OTHER): Payer: BC Managed Care – PPO | Admitting: Obstetrics and Gynecology

## 2022-02-21 VITALS — BP 102/64 | HR 90 | Resp 16 | Ht 64.0 in | Wt 114.1 lb

## 2022-02-21 DIAGNOSIS — R87615 Unsatisfactory cytologic smear of cervix: Secondary | ICD-10-CM

## 2022-02-21 DIAGNOSIS — Z124 Encounter for screening for malignant neoplasm of cervix: Secondary | ICD-10-CM | POA: Diagnosis not present

## 2022-02-21 LAB — POCT URINE PREGNANCY: Preg Test, Ur: NEGATIVE

## 2022-02-21 NOTE — Addendum Note (Signed)
Addended by: Chilton Greathouse on: 02/21/2022 12:56 PM   Modules accepted: Orders

## 2022-02-28 LAB — CYTOLOGY - PAP: Adequacy: ABNORMAL

## 2022-03-01 ENCOUNTER — Other Ambulatory Visit: Payer: Self-pay | Admitting: Nurse Practitioner

## 2022-03-01 DIAGNOSIS — L659 Nonscarring hair loss, unspecified: Secondary | ICD-10-CM

## 2022-03-05 DIAGNOSIS — F4310 Post-traumatic stress disorder, unspecified: Secondary | ICD-10-CM | POA: Diagnosis not present

## 2022-03-19 DIAGNOSIS — F4310 Post-traumatic stress disorder, unspecified: Secondary | ICD-10-CM | POA: Diagnosis not present

## 2022-04-08 ENCOUNTER — Encounter: Payer: Self-pay | Admitting: Nurse Practitioner

## 2022-04-09 ENCOUNTER — Encounter: Payer: Self-pay | Admitting: Obstetrics and Gynecology

## 2022-04-09 ENCOUNTER — Ambulatory Visit (INDEPENDENT_AMBULATORY_CARE_PROVIDER_SITE_OTHER): Payer: BC Managed Care – PPO | Admitting: Obstetrics and Gynecology

## 2022-04-09 ENCOUNTER — Other Ambulatory Visit (HOSPITAL_COMMUNITY)
Admission: RE | Admit: 2022-04-09 | Discharge: 2022-04-09 | Disposition: A | Discharge status: Home / Self Care | Payer: BC Managed Care – PPO | Source: Ambulatory Visit | Attending: Obstetrics and Gynecology | Admitting: Obstetrics and Gynecology

## 2022-04-09 VITALS — BP 109/55 | HR 88 | Ht 64.0 in | Wt 117.0 lb

## 2022-04-09 DIAGNOSIS — R87615 Unsatisfactory cytologic smear of cervix: Secondary | ICD-10-CM

## 2022-04-09 DIAGNOSIS — N888 Other specified noninflammatory disorders of cervix uteri: Secondary | ICD-10-CM | POA: Diagnosis not present

## 2022-04-09 DIAGNOSIS — F4310 Post-traumatic stress disorder, unspecified: Secondary | ICD-10-CM | POA: Diagnosis not present

## 2022-04-09 DIAGNOSIS — D069 Carcinoma in situ of cervix, unspecified: Secondary | ICD-10-CM | POA: Diagnosis not present

## 2022-04-09 NOTE — Patient Instructions (Signed)
Colposcopy, Care After  The following information offers guidance on how to care for yourself after your procedure. Your health care provider may also give you more specific instructions. If you have problems or questions, contact your health care provider. What can I expect after the procedure? If you had a colposcopy without a biopsy, you can expect to feel fine right away after your procedure. However, you may have some spotting of blood for a few days. You can return to your normal activities. If you had a colposcopy with a biopsy, it is common after the procedure to have: Soreness and mild pain. These may last for a few days. Mild vaginal bleeding or discharge that is dark-colored and grainy. This may last for a few days. The discharge may be caused by a liquid (solution) that was used during the procedure. You may need to wear a sanitary pad during this time. Spotting of blood for at least 48 hours after the procedure. Follow these instructions at home: Medicines Take over-the-counter and prescription medicines only as told by your health care provider. Talk with your health care provider about what type of over-the-counter pain medicines and prescription medicines you can start to take again. It is especially important to talk with your health care provider if you take blood thinners. Activity Avoid using douche products, using tampons, and having sex for at least 3 days after the procedure or for as long as told by your health care provider. Return to your normal activities as told by your health care provider. Ask your health care provider what activities are safe for you. General instructions Ask your health care provider if you may take baths, swim, or use a hot tub. You may take showers. If you use birth control (contraception), continue to use it. Keep all follow-up visits. This is important. Contact a health care provider if: You have a fever or chills. You faint or feel  light-headed. Get help right away if: You have heavy bleeding from your vagina or pass blood clots. Heavy bleeding is bleeding that soaks through a sanitary pad in less than 1 hour. You have vaginal discharge that is abnormal, is yellow in color, or smells bad. This could be a sign of infection. You have severe pain or cramps in your lower abdomen that do not go away with medicine. Summary If you had a colposcopy without a biopsy, you can expect to feel fine right away, but you may have some spotting of blood for a few days. You can return to your normal activities. If you had a colposcopy with a biopsy, it is common to have mild pain for a few days and spotting for 48 hours after the procedure. Avoid using douche products, using tampons, and having sex for at least 3 days after the procedure or for as long as told by your health care provider. Get help right away if you have heavy bleeding, severe pain, or signs of infection. This information is not intended to replace advice given to you by your health care provider. Make sure you discuss any questions you have with your health care provider. Document Revised: 02/17/2021 Document Reviewed: 02/17/2021 Elsevier Patient Education  2023 Elsevier Inc.  

## 2022-04-09 NOTE — Progress Notes (Signed)
    GYNECOLOGY OFFICE COLPOSCOPY PROCEDURE NOTE  51 y.o. [id number] here for colposcopy for Unsatisfactory for evaluation due to extremely scant cellularity pap smear on 02/21/2022. Her most recent annual exam was on 11/28/2021. Her most recent Pap smear showed:Unsatisfactory pap, HPV neg x 2 (had repeat on 01/09/22 showing same results).   Patient gave informed written consent, time out was performed.  Placed in lithotomy position. Cervix viewed with speculum and colposcope after application of acetic acid.   Colposcopy adequate? Yes  acetowhite lesion(s) noted at 6 o'clock with several small vessels present; corresponding biopsies obtained.  ECC specimen obtained. All specimens were labeled and sent to pathology.  Chaperone was present during entire procedure.  Patient was given post procedure instructions.  Will follow up pathology and manage accordingly; patient will be contacted with results and recommendations.  Routine preventative health maintenance measures emphasized.    Rubie Maid, MD [facility]

## 2022-04-11 ENCOUNTER — Telehealth: Payer: Self-pay | Admitting: Obstetrics and Gynecology

## 2022-04-11 ENCOUNTER — Ambulatory Visit: Payer: Self-pay | Admitting: Adult Health

## 2022-04-11 ENCOUNTER — Encounter: Payer: Self-pay | Admitting: Obstetrics and Gynecology

## 2022-04-11 LAB — SURGICAL PATHOLOGY

## 2022-04-11 NOTE — Progress Notes (Signed)
Typhoid Vi given to patient for upcoming trip to [place].   Typhoid VI Lot number [id number] Exp: [id number]   After obtaining informed consent, the immunization is given by Orson Gear RN, AGNP-C.

## 2022-04-11 NOTE — Telephone Encounter (Signed)
Pt would like to discuss the finding from her recent Colpo. Pt states you can message her via My Chart if that more convenient.

## 2022-04-11 NOTE — Telephone Encounter (Signed)
Sent patient Mychart message.

## 2022-04-14 ENCOUNTER — Telehealth: Payer: Self-pay | Admitting: Obstetrics and Gynecology

## 2022-04-14 NOTE — Telephone Encounter (Signed)
Pt called asking to scheduled LEEP- she received message via mychart, she is asking for LEEP to be done asap- she is leaving the country 8-13 and states she is concerned about post-procedure care as the conditions are not the best in [place]. She states that her 1st abnormal pap was back in Feb and feels this has been dragged out, states she had a few paps were it was unsatisfactory (not enough cells collected to result) and she is concerned. Please assist in scheduling as pt has decided to have in office and Dr.Cherry is full- usually she will do on a short surgery day but I was told to go through practice admin for scheduling concerns.

## 2022-04-16 ENCOUNTER — Telehealth: Payer: Self-pay | Admitting: Obstetrics and Gynecology

## 2022-04-16 NOTE — Telephone Encounter (Signed)
Patient called and was inquiring about scheduling her leep procedure. Pt would like this procedure to be done in office. Spoke with our coordinator here who informed me of having notified practice admin and is awaiting response for placement

## 2022-04-28 ENCOUNTER — Encounter: Payer: Self-pay | Admitting: Obstetrics and Gynecology

## 2022-04-28 ENCOUNTER — Other Ambulatory Visit (HOSPITAL_COMMUNITY)
Admission: RE | Admit: 2022-04-28 | Discharge: 2022-04-28 | Disposition: A | Discharge status: Home / Self Care | Payer: BC Managed Care – PPO | Source: Ambulatory Visit | Attending: Obstetrics and Gynecology | Admitting: Obstetrics and Gynecology

## 2022-04-28 ENCOUNTER — Ambulatory Visit: Payer: BC Managed Care – PPO | Admitting: Obstetrics and Gynecology

## 2022-04-28 VITALS — BP 107/70 | HR 88 | Wt 116.7 lb

## 2022-04-28 DIAGNOSIS — D069 Carcinoma in situ of cervix, unspecified: Secondary | ICD-10-CM | POA: Diagnosis not present

## 2022-04-28 DIAGNOSIS — R87613 High grade squamous intraepithelial lesion on cytologic smear of cervix (HGSIL): Secondary | ICD-10-CM | POA: Diagnosis not present

## 2022-04-28 NOTE — Progress Notes (Signed)
     GYNECOLOGY OFFICE PROCEDURE NOTE  Sydney French is a 51 y.o. [id number] here for LEEP. No GYN concerns. Pap smear and colposcopy history reviewed.    Pap: Unsatisfactory, HPV neg (x 2) Colpo Biopsy: 6 o'clock (random biopsy) with CIN III ECC Benign endocervical epithelium with atrophy  Risks, benefits, alternatives, and limitations of procedure explained to patient, including pain, bleeding, infection, failure to remove abnormal tissue and failure to cure dysplasia, need for repeat procedures, damage to pelvic organs, cervical incompetence.  Role of HPV,cervical dysplasia and need for close followup was empasized. Informed written consent was obtained. All questions were answered. Time out performed. Urine pregnancy test was negative.  ??Procedure: The patient was placed in lithotomy position and the bivalved coated speculum was placed in the patient's vagina. A grounding pad placed on the patient. Lugol's solution was applied to the cervix and areas of decreased uptake were noted around the transformation zone.   Local anesthesia was administered via an intracervical block using 10 ml of 2% Lidocaine with epinephrine. The suction was turned on and the Medium Fisher Cone Biopsy Excisor on 60 Watts of blended current was used to excise the area of decreased uptake and excise the entire transformation zone. An ECC was performed.  Excellent hemostasis was achieved using roller ball coagulation set at 60 Watts coagulation current.  Monsel's solution was then applied and the speculum was removed from the vagina. Specimens were sent to pathology.  ?The patient tolerated the procedure well. Post-operative instructions given to patient, including instruction to seek medical attention for persistent bright red bleeding, fever, abdominal/pelvic pain, dysuria, nausea or vomiting. She was also told about the possibility of having copious yellow to black tinged discharge for weeks. She was counseled to avoid  anything in the vagina (sex/douching/tampons) for 3 weeks. She has a 4 week post-operative check to assess wound healing, review results and discuss further management.     Rubie Maid, MD [facility]

## 2022-04-28 NOTE — Patient Instructions (Signed)
LEEP POST-PROCEDURE INSTRUCTIONS  You may take Ibuprofen, Aleve or Tylenol for pain if needed.  Cramping is normal.  You will have black and/or bloody discharge at first.  This will lighten and then turn clear before completely resolving.  This will take 2 to 3 weeks.  Put nothing in your vagina until the bleeding or discharge stops (usually 2 or3 days).  You need to call if you have redness around the biopsy site, if there is any unusual draining, if the bleeding is heavy, or if you are concerned.  Shower or bathe as normal  We will call you within one week with results or we will discuss the results at your follow-up appointment if needed.  You will need to return for a follow-up Pap smear as directed by your physician.

## 2022-04-28 NOTE — Telephone Encounter (Signed)
Patient appears in office today for LEEP procedure.

## 2022-05-01 LAB — SURGICAL PATHOLOGY

## 2022-05-02 DIAGNOSIS — F4310 Post-traumatic stress disorder, unspecified: Secondary | ICD-10-CM | POA: Diagnosis not present

## 2022-05-14 DIAGNOSIS — F4310 Post-traumatic stress disorder, unspecified: Secondary | ICD-10-CM | POA: Diagnosis not present

## 2022-05-21 ENCOUNTER — Encounter: Payer: BC Managed Care – PPO | Admitting: Obstetrics and Gynecology

## 2022-05-23 NOTE — Progress Notes (Unsigned)
    OBSTETRICS/GYNECOLOGY POST-OPERATIVE CLINIC VISIT  Subjective:     Sydney French is a 51 y.o. female who presents to the clinic 4 weeks status post  LEEP  for  CIN III on colposcopic biopsy .The patient is not having any pain. Pap smear and colposcopy history reviewed. She has some vaginal odor,  scanty discharge and a hint of vaginal bleeding.    Pap: Unsatisfactory, HPV neg (x 2) Colpo Biopsy: 6 o'clock (random biopsy) with CIN III ECC: Benign endocervical epithelium with atrophy   The following portions of the patient's history were reviewed and updated as appropriate: allergies, current medications, past family history, past medical history, past social history, past surgical history, and problem list.  Review of Systems A comprehensive review of systems was negative except for: Genitourinary: positive for pelvic pain, right sided.  Has been ongoing for several months. Had [place] by PCP in September with no significant findings. Pain is intermittent.  Notes she is worried about cancer.    Objective:   BP 103/70   Pulse 88   Resp 16   Ht $R'5\' 4"'WB$  (1.626 m)   Wt 117 lb 4.8 oz (53.2 kg)   BMI 20.13 kg/m  Body mass index is 20.13 kg/m.  General:  alert and no distress  Abdomen: soft, bowel sounds active, non-tender  Pelvis:   External genitalia normal. Vagina with no vaginal discharge.  Cervix healing well, post-LEEP changes    Pathology:   A.   ECTOCERVIX, LEEP:  Findings consistent with low-grade squamous intraepithelial lesion (CIN  I) with koilocytes.  No definite high-grade dysplasia or malignancy is seen.  Immunohistochemistry with appropriate controls for p16 and Ki-67 confirm  the absence of high-grade squamous intraepithelial lesion or malignancy.   B.   ENDOCERVIX, CURETTAGE:  Minute and scanty fragments of glandular epithelium.  No proper endocervical tissue is seen for evaluation.  Clinical correlation is required.    Assessment:   Patient s/p LEEP. Doing  well postoperatively. CIN I RLQ pain   Plan:   1.Pathology report discussed. 2. Activity restrictions: none 3. Anticipated return to work:  patient has already returned to work . 4. Discussion had with patient on other potential causes of abdominal pain. Discussed home comfort measures, can consider further workup if pain becomes more persistent or worsens. Has had colon screening with Cologuard, can consider referral to GI if no resolution. Denies urinary symptoms or previous issues. Can also consider referral to physical therapy if musculoskeletal.  5. Follow up: 1  year  for repeat pap smear.    Rubie Maid, MD [facility]

## 2022-05-27 ENCOUNTER — Encounter: Payer: Self-pay | Admitting: Obstetrics and Gynecology

## 2022-05-27 ENCOUNTER — Ambulatory Visit (INDEPENDENT_AMBULATORY_CARE_PROVIDER_SITE_OTHER): Payer: BC Managed Care – PPO | Admitting: Obstetrics and Gynecology

## 2022-05-27 VITALS — BP 103/70 | HR 88 | Resp 16 | Ht 64.0 in | Wt 117.3 lb

## 2022-05-27 DIAGNOSIS — Z09 Encounter for follow-up examination after completed treatment for conditions other than malignant neoplasm: Secondary | ICD-10-CM

## 2022-05-27 DIAGNOSIS — N870 Mild cervical dysplasia: Secondary | ICD-10-CM

## 2022-05-27 DIAGNOSIS — Z9889 Other specified postprocedural states: Secondary | ICD-10-CM

## 2022-05-27 DIAGNOSIS — R87615 Unsatisfactory cytologic smear of cervix: Secondary | ICD-10-CM

## 2022-05-27 DIAGNOSIS — R1031 Right lower quadrant pain: Secondary | ICD-10-CM

## 2022-05-27 NOTE — Patient Instructions (Signed)
Cervical Dysplasia  Cervical dysplasia is a condition in which the cells in a woman's cervix have abnormal changes. The cervix is the opening of the uterus. It is located between the vagina and the uterus. Cervical dysplasia may be an early sign of cervical cancer. If left untreated, this condition may become more severe and may progress to cervical cancer. Early detection, treatment, and follow-up care are very important. What are the causes? Cervical dysplasia is usually caused by a human papillomavirus (HPV) infection. HPV is spread from person to person through sexual contact. This includes oral, vaginal, or anal sex. HPV is the most common sexually transmitted infection (STI). You are more likely to be exposed to HPV through sexual contact if: You have had more than one sexual partner or you have a sexual partner who has multiple sexual partners. You do not use a condom during sex, especially with new sexual partners. What increases the risk? The following factors may make you more likely to develop this condition: Having a family history of cervical cancer or a personal history of cancer of the vagina or vulva. Having had an STI, such as herpes, chlamydia, or gonorrhea. Becoming sexually active before age 24. Having a weakened disease-fighting system (immunesystem). Smoking. Being the daughter of a woman who took diethylstilbestrol (DES), a synthetic estrogen, during pregnancy. What are the signs or symptoms? There are usually no symptoms of this condition. If you do have symptoms, they may include: Abnormal vaginal discharge. Bleeding between periods or after sex. Bleeding during menopause. Pain during sex. How is this diagnosed? This condition may be diagnosed with a Pap test. During this test, cells are swabbed from the cervix and checked under a microscope. If the Pap test is abnormal or if the cervix looks abnormal, you may also have a test in which a tissue sample is removed from  the cervix and looked at under a microscope(biopsy). How is this treated? Treatment varies based on the severity of the condition. Treatment may include: Cryotherapy. During this therapy, the abnormal cells are frozen with a steel-tipped instrument. Loop electrosurgical excision procedure (LEEP). LEEP removes abnormal tissue from the cervix. Surgery to remove abnormal tissue. This is usually done in more severe cases. Options include: A cone biopsy. This treatment removes the cervical canal and part of the center of the cervix. Hysterectomy. This is a surgery in which the uterus and cervix are removed. Follow these instructions at home: Take over-the-counter and prescription medicines only as told by your health care provider. Do not use tampons, have sex, or douche until your health care provider says it is safe. Keep all follow-up visits. This is important. Women who have been treated for cervical dysplasia should have regular pelvic exams and Pap tests. How is this prevented? Practice safe sex to help prevent STIs. Have regular Pap tests. Talk with your health care provider about how often you need these tests. Pap tests will help identify cell changes that can lead to cancer. Ask your health care provider about possible vaccines to protect yourself against HPV. Contact a health care provider if: You develop genital warts. The risk of cervical cancer is higher with certain types of HPV. Your menstrual period is heavier than normal or you develop bright red bleeding, which may include blood clots. You have abnormal vaginal discharge. You have a fever. Get help right away if: You have pain or cramps in the abdomen that get worse, and medicine does not help to relieve your pain. You feel  light-headed and are unusually weak, or you faint. Summary Cervical dysplasia is a condition in which a woman's cervix cells have abnormal changes. If left untreated, this condition may become more severe  and may progress to cervical cancer. Early detection, treatment, and follow-up care are very important in managing this condition. Have regular pelvic exams and Pap tests. Talk with your health care provider about how often you need these tests. Pap tests will help identify cell changes that can lead to cancer. This information is not intended to replace advice given to you by your health care provider. Make sure you discuss any questions you have with your health care provider. Document Revised: 03/30/2020 Document Reviewed: 03/30/2020 Elsevier Patient Education  [place].

## 2022-06-19 DIAGNOSIS — F4310 Post-traumatic stress disorder, unspecified: Secondary | ICD-10-CM | POA: Diagnosis not present

## 2022-06-23 ENCOUNTER — Ambulatory Visit: Payer: BC Managed Care – PPO | Admitting: Gastroenterology

## 2022-07-10 DIAGNOSIS — F4310 Post-traumatic stress disorder, unspecified: Secondary | ICD-10-CM | POA: Diagnosis not present

## 2022-07-21 DIAGNOSIS — F4310 Post-traumatic stress disorder, unspecified: Secondary | ICD-10-CM | POA: Diagnosis not present

## 2022-08-07 DIAGNOSIS — F4310 Post-traumatic stress disorder, unspecified: Secondary | ICD-10-CM | POA: Diagnosis not present

## 2022-08-12 DIAGNOSIS — D2261 Melanocytic nevi of right upper limb, including shoulder: Secondary | ICD-10-CM | POA: Diagnosis not present

## 2022-08-12 DIAGNOSIS — D2262 Melanocytic nevi of left upper limb, including shoulder: Secondary | ICD-10-CM | POA: Diagnosis not present

## 2022-08-12 DIAGNOSIS — D2271 Melanocytic nevi of right lower limb, including hip: Secondary | ICD-10-CM | POA: Diagnosis not present

## 2022-08-12 DIAGNOSIS — D225 Melanocytic nevi of trunk: Secondary | ICD-10-CM | POA: Diagnosis not present

## 2022-08-12 DIAGNOSIS — D1801 Hemangioma of skin and subcutaneous tissue: Secondary | ICD-10-CM | POA: Diagnosis not present

## 2022-09-03 DIAGNOSIS — F4310 Post-traumatic stress disorder, unspecified: Secondary | ICD-10-CM | POA: Diagnosis not present

## 2022-09-17 DIAGNOSIS — F4310 Post-traumatic stress disorder, unspecified: Secondary | ICD-10-CM | POA: Diagnosis not present

## 2022-10-15 DIAGNOSIS — F4310 Post-traumatic stress disorder, unspecified: Secondary | ICD-10-CM | POA: Diagnosis not present

## 2022-10-29 DIAGNOSIS — F4310 Post-traumatic stress disorder, unspecified: Secondary | ICD-10-CM | POA: Diagnosis not present

## 2022-11-05 ENCOUNTER — Ambulatory Visit: Payer: BC Managed Care – PPO | Admitting: Gastroenterology

## 2022-11-12 DIAGNOSIS — F4310 Post-traumatic stress disorder, unspecified: Secondary | ICD-10-CM | POA: Diagnosis not present

## 2022-11-20 ENCOUNTER — Encounter: Payer: BC Managed Care – PPO | Admitting: Nurse Practitioner

## 2022-11-26 ENCOUNTER — Encounter: Payer: BC Managed Care – PPO | Admitting: Nurse Practitioner

## 2022-11-26 DIAGNOSIS — F4310 Post-traumatic stress disorder, unspecified: Secondary | ICD-10-CM | POA: Diagnosis not present

## 2022-12-03 ENCOUNTER — Encounter: Payer: BC Managed Care – PPO | Admitting: Nurse Practitioner

## 2022-12-10 DIAGNOSIS — F4310 Post-traumatic stress disorder, unspecified: Secondary | ICD-10-CM | POA: Diagnosis not present

## 2022-12-19 ENCOUNTER — Other Ambulatory Visit: Payer: Self-pay | Admitting: Nurse Practitioner

## 2022-12-19 DIAGNOSIS — Z1231 Encounter for screening mammogram for malignant neoplasm of breast: Secondary | ICD-10-CM

## 2022-12-22 DIAGNOSIS — F4310 Post-traumatic stress disorder, unspecified: Secondary | ICD-10-CM | POA: Diagnosis not present

## 2023-01-07 ENCOUNTER — Ambulatory Visit
Admission: RE | Admit: 2023-01-07 | Discharge: 2023-01-07 | Disposition: A | Discharge status: Home / Self Care | Payer: BC Managed Care – PPO | Source: Ambulatory Visit | Attending: Nurse Practitioner | Admitting: Nurse Practitioner

## 2023-01-07 DIAGNOSIS — Z1231 Encounter for screening mammogram for malignant neoplasm of breast: Secondary | ICD-10-CM

## 2023-01-07 DIAGNOSIS — F4310 Post-traumatic stress disorder, unspecified: Secondary | ICD-10-CM | POA: Diagnosis not present

## 2023-01-09 ENCOUNTER — Other Ambulatory Visit: Payer: Self-pay | Admitting: Nurse Practitioner

## 2023-01-09 ENCOUNTER — Other Ambulatory Visit: Payer: Self-pay | Admitting: Internal Medicine

## 2023-01-09 DIAGNOSIS — R928 Other abnormal and inconclusive findings on diagnostic imaging of breast: Secondary | ICD-10-CM

## 2023-01-09 DIAGNOSIS — N63 Unspecified lump in unspecified breast: Secondary | ICD-10-CM

## 2023-01-13 ENCOUNTER — Ambulatory Visit
Admission: RE | Admit: 2023-01-13 | Discharge: 2023-01-13 | Disposition: A | Discharge status: Home / Self Care | Payer: BC Managed Care – PPO | Source: Ambulatory Visit | Attending: Nurse Practitioner | Admitting: Nurse Practitioner

## 2023-01-13 DIAGNOSIS — R922 Inconclusive mammogram: Secondary | ICD-10-CM | POA: Diagnosis not present

## 2023-01-13 DIAGNOSIS — R928 Other abnormal and inconclusive findings on diagnostic imaging of breast: Secondary | ICD-10-CM | POA: Diagnosis not present

## 2023-01-28 DIAGNOSIS — F4310 Post-traumatic stress disorder, unspecified: Secondary | ICD-10-CM | POA: Diagnosis not present

## 2023-02-11 DIAGNOSIS — F4310 Post-traumatic stress disorder, unspecified: Secondary | ICD-10-CM | POA: Diagnosis not present

## 2023-02-24 ENCOUNTER — Ambulatory Visit (INDEPENDENT_AMBULATORY_CARE_PROVIDER_SITE_OTHER): Payer: BC Managed Care – PPO | Admitting: Nurse Practitioner

## 2023-02-24 ENCOUNTER — Encounter: Payer: Self-pay | Admitting: Nurse Practitioner

## 2023-02-24 VITALS — BP 120/82 | HR 86 | Temp 98.1°F | Resp 16 | Ht 64.0 in | Wt 115.2 lb

## 2023-02-24 DIAGNOSIS — E782 Mixed hyperlipidemia: Secondary | ICD-10-CM

## 2023-02-24 DIAGNOSIS — L659 Nonscarring hair loss, unspecified: Secondary | ICD-10-CM | POA: Diagnosis not present

## 2023-02-24 DIAGNOSIS — D509 Iron deficiency anemia, unspecified: Secondary | ICD-10-CM | POA: Diagnosis not present

## 2023-02-24 DIAGNOSIS — Z124 Encounter for screening for malignant neoplasm of cervix: Secondary | ICD-10-CM | POA: Diagnosis not present

## 2023-02-24 DIAGNOSIS — R300 Dysuria: Secondary | ICD-10-CM | POA: Diagnosis not present

## 2023-02-24 DIAGNOSIS — E538 Deficiency of other specified B group vitamins: Secondary | ICD-10-CM

## 2023-02-24 DIAGNOSIS — E559 Vitamin D deficiency, unspecified: Secondary | ICD-10-CM

## 2023-02-24 DIAGNOSIS — Z0001 Encounter for general adult medical examination with abnormal findings: Secondary | ICD-10-CM | POA: Diagnosis not present

## 2023-02-24 NOTE — Progress Notes (Unsigned)
[facility] [address] [place], [place] [address]  Internal MEDICINE  Office Visit Note  Patient Name: Sydney French  [id number]  [id number]  Date of Service: 02/24/2023  Chief Complaint  Patient presents with   Annual Exam    HPI Shakhia presents for an annual well visit and physical exam.  Well-appearing 52 y.o. year-old @female @ with ___________  Routine CRC screening: Routine mammogram: DEXA scan: Pap smear: Eye exam and/or foot exam: Labs:  New or worsening pain: Other concerns: Pap smear Slightly elevated LDL   Utd on vaccinations  No prescription meds right now, takes multipvitamin.    Current Medication: Outpatient Encounter Medications as of 02/24/2023  Medication Sig   diphenhydrAMINE HCl (BENADRYL ALLERGY PO) Take by mouth.   etodolac (LODINE) 500 MG tablet Take 1 tablet (500 mg total) by mouth 2 (two) times daily.   Ferrous Fumarate (HEMOCYTE) 324 (106 Fe) MG TABS tablet Take 1 tablet (106 mg of iron total) by mouth daily.   ibuprofen (ADVIL) 200 MG tablet Take 200 mg by mouth every 6 (six) hours as needed.   minoxidil (LONITEN) 10 MG tablet TAKE 1 TABLET BY MOUTH EVERY DAY   tretinoin (RETIN-A) 0.025 % cream Apply topically at bedtime.   [DISCONTINUED] ergocalciferol (DRISDOL) 1.25 MG (50000 UT) capsule Take 1 capsule (50,000 Units total) by mouth once a week.   No facility-administered encounter medications on file as of 02/24/2023.    Surgical History: Past Surgical History:  Procedure Laterality Date   CESAREAN SECTION     child birth natural     1995, 1998, 2003   CHOLECYSTECTOMY  3   DILATION AND CURETTAGE OF UTERUS     GALLBLADDER SURGERY      Medical History: Past Medical History:  Diagnosis Date   Endometriosis    History of ovarian cyst    Irregular periods    Osteoarthritis     Family History: Family History  Problem Relation Age of Onset   Melanoma Mother    Cancer Mother    Heart attack Father    Melanoma  Brother    Asthma Neg Hx    Breast cancer Neg Hx    Stroke Neg Hx    Glaucoma Neg Hx    Arthritis Neg Hx     Social History   Socioeconomic History   Marital status: Married    Spouse name: Not on file   Number of children: Not on file   Years of education: Not on file   Highest education level: Not on file  Occupational History   Not on file  Tobacco Use   Smoking status: Never   Smokeless tobacco: Never  Vaping Use   Vaping Use: Not on file  Substance and Sexual Activity   Alcohol use: No   Drug use: No   Sexual activity: Yes  Other Topics Concern   Not on file  Social History Narrative   Not on file   Social Determinants of Health   Financial Resource Strain: Not on file  Food Insecurity: Not on file  Transportation Needs: Not on file  Physical Activity: Not on file  Stress: Not on file  Social Connections: Not on file  Intimate Partner Violence: Not on file      Review of Systems  Vital Signs: BP 120/82   Pulse 86   Temp 98.1 F (36.7 C)   Resp 16   Ht 5\' 4"  (1.626 m)   Wt 115 lb 3.2 oz (  52.3 kg)   SpO2 97%   BMI 19.77 kg/m    Physical Exam     Assessment/Plan: 1. Encounter for routine adult health examination with abnormal findings - Lipid Profile - CMP14+EGFR - CBC with Differential/Platelet - Iron, TIBC and Ferritin Panel - B12 and Folate Panel - Vitamin D (25 hydroxy) - TSH + free T4  2. B12 deficiency - Lipid Profile - CMP14+EGFR - CBC with Differential/Platelet - Iron, TIBC and Ferritin Panel - B12 and Folate Panel - Vitamin D (25 hydroxy) - TSH + free T4  3. Iron deficiency anemia, unspecified iron deficiency anemia type - Lipid Profile - CMP14+EGFR - CBC with Differential/Platelet - Iron, TIBC and Ferritin Panel - B12 and Folate Panel - Vitamin D (25 hydroxy) - TSH + free T4  4. Hair loss - Lipid Profile - CMP14+EGFR - CBC with Differential/Platelet - Iron, TIBC and Ferritin Panel - B12 and Folate Panel -  Vitamin D (25 hydroxy) - TSH + free T4  5. Vitamin D deficiency - Lipid Profile - CMP14+EGFR - CBC with Differential/Platelet - Iron, TIBC and Ferritin Panel - B12 and Folate Panel - Vitamin D (25 hydroxy) - TSH + free T4  6. Mixed hyperlipidemia - Lipid Profile - CMP14+EGFR - CBC with Differential/Platelet - Iron, TIBC and Ferritin Panel - B12 and Folate Panel - Vitamin D (25 hydroxy) - TSH + free T4  7. Encounter for screening for malignant neoplasm of cervix - IGP, Aptima HPV     General Counseling: Renaye verbalizes understanding of the findings of todays visit and agrees with plan of treatment. I have discussed any further diagnostic evaluation that may be needed or ordered today. We also reviewed her medications today. she has been encouraged to call the office with any questions or concerns that should arise related to todays visit.    Orders Placed This Encounter  Procedures   Lipid Profile   CMP14+EGFR   CBC with Differential/Platelet   Iron, TIBC and Ferritin Panel   B12 and Folate Panel   Vitamin D (25 hydroxy)   TSH + free T4    No orders of the defined types were placed in this encounter.   Return for CPE, Amarii Amy PCP and otherwise as needed. .   Total time spent:*** Minutes Time spent includes review of chart, medications, test results, and follow up plan with the patient.   [place] Controlled Substance Database was reviewed by me.  This patient was seen by Sallyanne Kuster, FNP-C in collaboration with Dr. Beverely Risen as a part of collaborative care agreement.  Maleek Craver R. Tedd Sias, MSN, FNP-C Internal medicine

## 2023-02-25 ENCOUNTER — Encounter: Payer: Self-pay | Admitting: Nurse Practitioner

## 2023-02-25 DIAGNOSIS — F4310 Post-traumatic stress disorder, unspecified: Secondary | ICD-10-CM | POA: Diagnosis not present

## 2023-02-25 LAB — UA/M W/RFLX CULTURE, ROUTINE
Bilirubin, UA: NEGATIVE
Glucose, UA: NEGATIVE
Ketones, UA: NEGATIVE
Leukocytes,UA: NEGATIVE
Nitrite, UA: NEGATIVE
Protein,UA: NEGATIVE
RBC, UA: NEGATIVE
Specific Gravity, UA: 1.007 (ref 1.005–1.030)
Urobilinogen, Ur: 0.2 mg/dL (ref 0.2–1.0)
pH, UA: 6 (ref 5.0–7.5)

## 2023-02-25 LAB — MICROSCOPIC EXAMINATION
Bacteria, UA: NONE SEEN
Casts: NONE SEEN /lpf
Epithelial Cells (non renal): NONE SEEN /hpf (ref 0–10)
RBC, Urine: NONE SEEN /hpf (ref 0–2)
WBC, UA: NONE SEEN /hpf (ref 0–5)

## 2023-03-04 LAB — IGP, APTIMA HPV: HPV Aptima: NEGATIVE

## 2023-03-12 DIAGNOSIS — F4310 Post-traumatic stress disorder, unspecified: Secondary | ICD-10-CM | POA: Diagnosis not present

## 2023-03-24 DIAGNOSIS — Z0001 Encounter for general adult medical examination with abnormal findings: Secondary | ICD-10-CM | POA: Diagnosis not present

## 2023-03-24 DIAGNOSIS — D509 Iron deficiency anemia, unspecified: Secondary | ICD-10-CM | POA: Diagnosis not present

## 2023-03-24 DIAGNOSIS — L659 Nonscarring hair loss, unspecified: Secondary | ICD-10-CM | POA: Diagnosis not present

## 2023-03-24 DIAGNOSIS — E538 Deficiency of other specified B group vitamins: Secondary | ICD-10-CM | POA: Diagnosis not present

## 2023-03-24 DIAGNOSIS — E782 Mixed hyperlipidemia: Secondary | ICD-10-CM | POA: Diagnosis not present

## 2023-03-24 DIAGNOSIS — E559 Vitamin D deficiency, unspecified: Secondary | ICD-10-CM | POA: Diagnosis not present

## 2023-03-25 LAB — IRON,TIBC AND FERRITIN PANEL
Ferritin: 94 ng/mL (ref 15–150)
Iron Saturation: 30 % (ref 15–55)
Iron: 106 ug/dL (ref 27–159)
Total Iron Binding Capacity: 348 ug/dL (ref 250–450)
UIBC: 242 ug/dL (ref 131–425)

## 2023-03-25 LAB — CBC WITH DIFFERENTIAL/PLATELET
Basophils Absolute: 0.1 10*3/uL (ref 0.0–0.2)
Basos: 1 %
EOS (ABSOLUTE): 0.1 10*3/uL (ref 0.0–0.4)
Eos: 1 %
Hematocrit: 40.8 % (ref 34.0–46.6)
Hemoglobin: 13.3 g/dL (ref 11.1–15.9)
Immature Grans (Abs): 0 10*3/uL (ref 0.0–0.1)
Immature Granulocytes: 0 %
Lymphocytes Absolute: 2.2 10*3/uL (ref 0.7–3.1)
Lymphs: 34 %
MCH: 29.9 pg (ref 26.6–33.0)
MCHC: 32.6 g/dL (ref 31.5–35.7)
MCV: 92 fL (ref 79–97)
Monocytes Absolute: 0.5 10*3/uL (ref 0.1–0.9)
Monocytes: 7 %
Neutrophils Absolute: 3.5 10*3/uL (ref 1.4–7.0)
Neutrophils: 57 %
Platelets: 247 10*3/uL (ref 150–450)
RBC: 4.45 x10E6/uL (ref 3.77–5.28)
RDW: 13.1 % (ref 11.7–15.4)
WBC: 6.4 10*3/uL (ref 3.4–10.8)

## 2023-03-25 LAB — LIPID PANEL
Chol/HDL Ratio: 3 ratio (ref 0.0–4.4)
Cholesterol, Total: 204 mg/dL — ABNORMAL HIGH (ref 100–199)
HDL: 68 mg/dL (ref >39)
LDL Chol Calc (NIH): 125 mg/dL — ABNORMAL HIGH (ref 0–99)
Triglycerides: 60 mg/dL (ref 0–149)
VLDL Cholesterol Cal: 11 mg/dL (ref 5–40)

## 2023-03-25 LAB — CMP14+EGFR
ALT: 20 IU/L (ref 0–32)
AST: 24 IU/L (ref 0–40)
Albumin: 4.7 g/dL (ref 3.8–4.9)
Alkaline Phosphatase: 74 IU/L (ref 44–121)
BUN/Creatinine Ratio: 16 (ref 9–23)
BUN: 10 mg/dL (ref 6–24)
Bilirubin Total: 0.5 mg/dL (ref 0.0–1.2)
CO2: 23 mmol/L (ref 20–29)
Calcium: 9.2 mg/dL (ref 8.7–10.2)
Chloride: 98 mmol/L (ref 96–106)
Creatinine, Ser: 0.64 mg/dL (ref 0.57–1.00)
Globulin, Total: 3.3 g/dL (ref 1.5–4.5)
Glucose: 94 mg/dL (ref 70–99)
Potassium: 3.6 mmol/L (ref 3.5–5.2)
Sodium: 135 mmol/L (ref 134–144)
Total Protein: 8 g/dL (ref 6.0–8.5)
eGFR: 105 mL/min/{1.73_m2} (ref >59)

## 2023-03-25 LAB — TSH+FREE T4
Free T4: 1.43 ng/dL (ref 0.82–1.77)
TSH: 0.907 u[IU]/mL (ref 0.450–4.500)

## 2023-03-25 LAB — B12 AND FOLATE PANEL
Folate: >20 ng/mL (ref >3.0)
Vitamin B-12: 1035 pg/mL (ref 232–1245)

## 2023-03-25 LAB — VITAMIN D 25 HYDROXY (VIT D DEFICIENCY, FRACTURES): Vit D, 25-Hydroxy: 29.3 ng/mL — ABNORMAL LOW (ref 30.0–100.0)

## 2023-03-30 ENCOUNTER — Telehealth: Payer: Self-pay

## 2023-03-31 DIAGNOSIS — F4310 Post-traumatic stress disorder, unspecified: Secondary | ICD-10-CM | POA: Diagnosis not present

## 2023-04-02 ENCOUNTER — Other Ambulatory Visit: Payer: Self-pay

## 2023-04-03 ENCOUNTER — Telehealth: Payer: Self-pay | Admitting: Obstetrics and Gynecology

## 2023-04-03 NOTE — Telephone Encounter (Signed)
The patient is calling to follow up on results from her pap smear this year. The patient states "she saw her PCP and was advised to contact Dr. Valentino Saxon to find out if we need to do another pap or if she needs to be scheduled for Colposcopy". The patient is aware Dr. Valentino Saxon is out of clinic today. The patient says "her results are in the system and her pcp should be sending a message". Please advise scheduling?

## 2023-04-06 NOTE — Telephone Encounter (Signed)
Patient can have a repeat pap smear with colposcopy performed sometime later this month. Please reach out to schedule.

## 2023-04-06 NOTE — Telephone Encounter (Signed)
I contacted the patient via phone. I advised the patient via voicemail to call back to confirm scheduled Colposcopy with Dr Valentino Saxon on Tuesday, 7/30 at 3:15 pm.

## 2023-04-07 NOTE — Telephone Encounter (Signed)
Patient notified

## 2023-04-07 NOTE — Telephone Encounter (Signed)
I contacted the patient via phone. She confirmed schedule about for 7/30 with Dr. Valentino Saxon for repeat pap w Colposcopy.

## 2023-04-13 DIAGNOSIS — F4310 Post-traumatic stress disorder, unspecified: Secondary | ICD-10-CM | POA: Diagnosis not present

## 2023-04-29 DIAGNOSIS — F4310 Post-traumatic stress disorder, unspecified: Secondary | ICD-10-CM | POA: Diagnosis not present

## 2023-05-05 ENCOUNTER — Ambulatory Visit (INDEPENDENT_AMBULATORY_CARE_PROVIDER_SITE_OTHER): Payer: BC Managed Care – PPO | Admitting: Obstetrics and Gynecology

## 2023-05-05 ENCOUNTER — Other Ambulatory Visit (HOSPITAL_COMMUNITY)
Admission: RE | Admit: 2023-05-05 | Discharge: 2023-05-05 | Disposition: A | Discharge status: Home / Self Care | Payer: BC Managed Care – PPO | Source: Ambulatory Visit | Attending: Obstetrics and Gynecology | Admitting: Obstetrics and Gynecology

## 2023-05-05 DIAGNOSIS — R87613 High grade squamous intraepithelial lesion on cytologic smear of cervix (HGSIL): Secondary | ICD-10-CM

## 2023-05-05 DIAGNOSIS — Z87410 Personal history of cervical dysplasia: Secondary | ICD-10-CM

## 2023-05-05 DIAGNOSIS — Z9889 Other specified postprocedural states: Secondary | ICD-10-CM

## 2023-05-05 DIAGNOSIS — R87615 Unsatisfactory cytologic smear of cervix: Secondary | ICD-10-CM | POA: Insufficient documentation

## 2023-05-05 NOTE — Progress Notes (Signed)
    GYNECOLOGY OFFICE COLPOSCOPY PROCEDURE NOTE  52 y.o. [id number] here for colposcopy for  Unsatisfactory  pap smear on 02/24/2023. Patient with a history of unsatisfactory pap smears for at least the past 3-4 years. Had colposcopy performed last year which was positive for CIN III on biopsy, underwent LEEP on 04/28/22. Discussed role for HPV in cervical dysplasia, need for surveillance.  Patient gave informed written consent, time out was performed.  Placed in lithotomy position. Cervix viewed with speculum and colposcope after application of acetic acid.   Colposcopy adequate? No (unable to see visualize transformation zone due to significant stenosis due to post-LEEP changes).   No mosaicism, no punctation, no abnormal vasculature, and HPV changes noted at 9 o'clock; corresponding biopsies obtained.  ECC specimen attempted but due to significant stenosis, likley scant cells obtained.  All specimens were labeled and sent to pathology.  Chaperone was present during entire procedure.   Patient was given post procedure instructions.  Will follow up pathology and manage accordingly; patient will be contacted with results and recommendations.  Routine preventative health maintenance measures emphasized.    Hildred Laser, MD [facility]

## 2023-05-13 DIAGNOSIS — F4310 Post-traumatic stress disorder, unspecified: Secondary | ICD-10-CM | POA: Diagnosis not present

## 2023-05-27 DIAGNOSIS — F4310 Post-traumatic stress disorder, unspecified: Secondary | ICD-10-CM | POA: Diagnosis not present

## 2023-06-03 ENCOUNTER — Encounter: Payer: Self-pay | Admitting: Obstetrics and Gynecology

## 2023-06-03 ENCOUNTER — Telehealth: Payer: Self-pay | Admitting: Obstetrics and Gynecology

## 2023-06-03 ENCOUNTER — Ambulatory Visit: Payer: BC Managed Care – PPO | Admitting: Obstetrics and Gynecology

## 2023-06-03 VITALS — BP 117/75 | HR 88 | Resp 16 | Ht 64.0 in | Wt 115.5 lb

## 2023-06-03 DIAGNOSIS — R87615 Unsatisfactory cytologic smear of cervix: Secondary | ICD-10-CM

## 2023-06-03 DIAGNOSIS — Z9889 Other specified postprocedural states: Secondary | ICD-10-CM

## 2023-06-03 DIAGNOSIS — Z87410 Personal history of cervical dysplasia: Secondary | ICD-10-CM | POA: Diagnosis not present

## 2023-06-03 NOTE — Telephone Encounter (Signed)
I am scheduling the LEEP procedure for this pt, but the time that you specified (Sept. 16 at 9:00) for the procedure room is not available.  Is there another time that you would be available for this procedure?

## 2023-06-03 NOTE — Patient Instructions (Signed)
Colposcopy  Colposcopy is a procedure to examine the lowest part of the uterus (cervix) for abnormalities or signs of disease. This procedure is done using an instrument that makes objects appear larger and provides light (colposcope). During the procedure, your health care provider may remove a tissue sample to look at under a microscope (biopsy). A biopsy may be done if any unusual cells are seen during the colposcopy. You may have a colposcopy if you have: An abnormal Pap smear, also called a Pap test. This screening test is used to check for signs of cancer or infection of the vagina, cervix, and uterus. An HPV (human papillomavirus) test and get a positive result for a type of HPV that puts you at high risk of cancer. Certain conditions or symptoms, such as: A sore, or lesion, on your cervix. Genital warts on your vulva, vagina, or cervix. Pain during sex. Vaginal bleeding, especially after sex. A growth on your cervix (cervical polyp) that needs to be removed. Let your health care provider know about: Any allergies you have, including allergies to medicines, latex, or iodine. All medicines you are taking, including vitamins, herbs, eye drops, creams, and over-the-counter medicines. Any bleeding problems you have. Any surgeries you have had. Any medical conditions you have, such as pelvic inflammatory disease (PID) or an endometrial disorder. The pattern of your menstrual cycles and the form of birth control (contraception) you use, if any. Your medical history, including any cervical treatments and how well you tolerated the procedure (if you have ever fainted). Whether you are pregnant or may be pregnant. What are the risks? Generally, this is a safe procedure. However, problems may occur, including: Infection. Symptoms of infection may include fever, bad-smelling vaginal discharge, or pelvic pain. Vaginal bleeding. Allergic reactions to medicines. Damage to nearby structures or  organs. What happens before the procedure? Medicines Ask your health care provider about: Changing or stopping your regular medicines. This is especially important if you are taking diabetes medicines or blood thinners. Taking medicines such as aspirin and ibuprofen. These medicines can thin your blood. Do not take these medicines unless your health care provider tells you to take them. Your health care provider will likely tell you to avoid taking aspirin, or medicine that contains aspirin, for 7 days before the procedure. Taking over-the-counter medicines, vitamins, herbs, and supplements. General instructions Tell your health care provider if you have your menstrual period now or will have it at the time of your procedure. A colposcopy is not normally done during your menstrual period. If you use contraception, continue to use it before your procedure. For 24 hours before the procedure: Do not use douche products or tampons. Do not use medicines, creams, or suppositories in the vagina. Do not have sex or insert anything into your vagina. Ask your health care provider what steps will be taken to prevent infection. What happens during the procedure? You will lie down on your back, with your feet in foot rests (stirrups). An instrument called a speculum will be inserted into your vagina. This will be used so your health care provider can see your cervix and the inside of your vagina. A cotton swab will be used to place a small amount of a liquid (solution) on the areas to be examined. This solution makes it easier to see abnormal cells. You may feel a slight burning during this part. The colposcope will be used to scan the cervix with a bright white light. The colposcope will be held near   your vulva and will make your vulva, vagina, and cervix look bigger so they can be seen better. If a biopsy is needed: You may be given a medicine to numb the area (local anesthetic). Surgical tools will be  used to remove mucus and cells through your vagina. You may feel mild pain while the tissue sample is removed. Bleeding may occur. A solution may be used to stop the bleeding. The tissue removed will be sent to a lab to be looked at under a microscope. The procedure may vary among health care providers and hospitals. What happens after the procedure? You may have some cramping in your abdomen. This should go away after a few minutes. It is up to you to get the results of your procedure. Ask your health care provider, or the department that is doing the procedure, when your results will be ready. Summary Colposcopy is a procedure to examine the lowest part of the uterus (cervix), for signs of disease. A biopsy may be done as part of the procedure. You may have some cramping in your abdomen. This should go away after a few minutes. It is up to you to get the results of your procedure. Ask your health care provider, or the department that is doing the procedure, when your results will be ready. This information is not intended to replace advice given to you by your health care provider. Make sure you discuss any questions you have with your health care provider. Document Revised: 02/17/2021 Document Reviewed: 02/17/2021 Elsevier Patient Education  2024 [employer] Inc[employer]  

## 2023-06-03 NOTE — Progress Notes (Signed)
    GYNECOLOGY PROGRESS NOTE  Subjective:    Patient ID: Sydney French, female    DOB: 09/30/1971, 52 y.o.   MRN: [id number]  HPI  Patient is a 52 y.o. G52P0020 female who presents for follow up after colposcopy, performed 05/05/2023. She had an unsatisfactory  pap smear on 02/24/2023. Patient with a history of unsatisfactory pap smears for at least the past 3-4 years. Had colposcopy performed last year which was positive for CIN III on biopsy, underwent LEEP on 04/28/22.   Most recent biopsy results noting:  A. CERVICAL BIOPSY 9:00:  -Morphologically consistent with high-grade squamous intraepithelial  lesion (HG SIL).   Note: While morphologically there appears to be full-thickness  hyperchromatic enlarged nuclei there is slightly poor orientation  precluding definitive grading.  In addition, a confirmatory  immunohistochemical stain for p16 was performed with absence of  blocklike staining.  HGSIL is sometimes negative for p16, and if  clinically indicated confirmatory HPV ISH could be performed.   B. ENDOCERVICAL CURETTAGE:  -  Essentially nondiagnostic, mucin only.    Repeat pap from 05/05/2023 continues to show unsatisfactory pap smear.    The following portions of the patient's history were reviewed and updated as appropriate: allergies, current medications, past family history, past medical history, past social history, past surgical history, and problem list.  Review of Systems Pertinent items noted in HPI and remainder of comprehensive ROS otherwise negative.   Objective:   Blood pressure 117/75, pulse 88, resp. rate 16, height 5\' 4"  (1.626 m), weight 115 lb 8 oz (52.4 kg).  Body mass index is 19.83 kg/m. General appearance: alert and no distress Remainder of exam deferred.     Assessment:   History of unsatisfactory pap smears History of LEEP HGSIL    Plan:   Lengthy discussion had with patient regarding her recent biopsy results. Also discussed further  management options including repeating LEEP (as area is focal), cold knife cone (more extensive excision, more likely to remove any further cells), or hysterectomy, as patient still requires more invasive follow ups with colposcopy with paps due to history of unsatisfactory pap smears. Patient reports she desires to repeat the LEEP.  Will schedule in the next several weeks.    A total of 20 minutes were spent face-to-face with the patient during this encounter and over half of that time dealt with counseling and coordination of care.  Hildred Laser, MD [facility]

## 2023-06-11 DIAGNOSIS — F4310 Post-traumatic stress disorder, unspecified: Secondary | ICD-10-CM | POA: Diagnosis not present

## 2023-06-22 ENCOUNTER — Other Ambulatory Visit (HOSPITAL_COMMUNITY)
Admission: RE | Admit: 2023-06-22 | Discharge: 2023-06-22 | Disposition: A | Discharge status: Home / Self Care | Payer: BC Managed Care – PPO | Source: Ambulatory Visit | Attending: Obstetrics and Gynecology | Admitting: Obstetrics and Gynecology

## 2023-06-22 ENCOUNTER — Ambulatory Visit (INDEPENDENT_AMBULATORY_CARE_PROVIDER_SITE_OTHER): Payer: BC Managed Care – PPO | Admitting: Obstetrics and Gynecology

## 2023-06-22 VITALS — BP 117/76 | HR 93 | Ht 64.0 in | Wt 113.1 lb

## 2023-06-22 DIAGNOSIS — D069 Carcinoma in situ of cervix, unspecified: Secondary | ICD-10-CM

## 2023-06-22 DIAGNOSIS — N870 Mild cervical dysplasia: Secondary | ICD-10-CM

## 2023-06-22 DIAGNOSIS — N871 Moderate cervical dysplasia: Secondary | ICD-10-CM | POA: Diagnosis not present

## 2023-06-22 NOTE — Progress Notes (Signed)
     GYNECOLOGY OFFICE PROCEDURE NOTE  Sydney French is a 52 y.o. G7P0020 here for LEEP. No GYN concerns. Pap smear and colposcopy history reviewed.    Pap: Unsatisfactory on 02/24/2023. Patient with a history of unsatisfactory pap smears for at least the past 3-4 years. Repeat pap on 05/05/2023 also still showing unsatisfactory results. Colpo Biopsy: Cervical biopsy at 9 o'clock with HGSIL on 05/05/2023. Had colposcopy performed last year which was positive for CIN III on biopsy at 12 o'clock, underwent LEEP on 04/28/22.  ECC: Non-diagnostic.  H/o significant cervical stenosis  Risks, benefits, alternatives, and limitations of procedure explained to patient, including pain, bleeding, infection, failure to remove abnormal tissue and failure to cure dysplasia, need for repeat procedures, damage to pelvic organs, cervical incompetence.  Role of HPV,cervical dysplasia and need for close followup was empasized. Informed written consent was obtained. All questions were answered. Time out performed. Urine pregnancy test was negative.  ??Procedure: The patient was placed in lithotomy position and the bivalved coated speculum was placed in the patient's vagina. A grounding pad placed on the patient. Lugol's solution was applied to the cervix and areas of decreased uptake were noted around the transformation zone.   Local anesthesia was administered via an intracervical block using 10 ml of 2% Lidocaine with epinephrine. The suction was turned on and the Medium Fisher Cone Biopsy Excisor on 60 Watts of blended current was used to excise the area of decreased uptake and excise the entire transformation zone. An ECC was performed.  Excellent hemostasis was achieved using roller ball coagulation set at 60 Watts coagulation current. Monsel's solution was then applied and the speculum was removed from the vagina. Specimens were sent to pathology.  ?The patient tolerated the procedure well. Post-operative  instructions given to patient, including instruction to seek medical attention for persistent bright red bleeding, fever, abdominal/pelvic pain, dysuria, nausea or vomiting. She was also told about the possibility of having copious yellow to black tinged discharge for weeks. She was counseled to avoid anything in the vagina (sex/douching/tampons) for 3 weeks. She has a 4 week post-operative check to assess wound healing, review results and discuss further management.    Hildred Laser, MD [facility]

## 2023-06-22 NOTE — Patient Instructions (Signed)
Loop Electrosurgical Excision Procedure Loop electrosurgical excision procedure (LEEP) is the cutting and removal (excision) of tissue from the cervix. The cervix is the bottom part of the uterus that opens into the vagina. The tissue that is removed from the cervix is examined to see if there are cancer cells or cells that might turn into cancer (precancerous cells). LEEP may be done when: You have abnormal bleeding from your cervix. You have an abnormal Pap test result. Your health care provider finds abnormalities on your cervix during an exam. LEEP typically only takes a few minutes and is often done in the health care provider's office. The procedure is safe for women who are trying to get pregnant. The procedure is usually not done during a menstrual period or during pregnancy. Tell a health care provider about: Any allergies you have. All medicines you are taking, including vitamins, herbs, eye drops, creams, and over-the-counter medicines. Any problems you or family members have had with anesthetic medicines. Any bleeding problems you have. Any medical conditions you have or have had. This includes current or past vaginal infections, such as herpes or STIs (sexually transmitted infections). Whether you are pregnant or may be pregnant. If you are having vaginal bleeding on the day of the procedure. What are the risks? Generally, this is a safe procedure. However, problems may occur, including: Infection. Bleeding. Allergic reactions to medicines. Changes or scarring in the cervix. Damage to nearby structures or organs. Increased risk of early (preterm) labor in future pregnancies. What happens before the procedure? Ask your health care provider about: Changing or stopping your regular medicines. This is especially important if you are taking diabetes medicines or blood thinners. Taking medicines such as aspirin and ibuprofen. These medicines can thin your blood. Do not take these  medicines unless your health care provider tells you to take them. Taking over-the-counter medicines, vitamins, herbs, and supplements. Your health care provider may recommend that you take pain medicine before the procedure. Ask your health care provider if you should plan to have a responsible adult take you home after the procedure. What happens during the procedure?  An instrument called a speculum will be placed in your vagina. This will allow your health care provider to see your cervix. You will be given a medicine to numb the area (local anesthetic). The medicine will be injected into your cervix and the surrounding area. A solution will be applied to your cervix. This solution will help the health care provider find the abnormal cells that need to be removed. A thin wire loop will be passed through your vagina to your cervix. The wire will remove layers of abnormal cervical cells. The wire will burn (cauterize) the cervical tissue with an electrical current during cell removal. Open blood vessels will be cauterized to prevent bleeding. You might feel some pressure, aching, and cramping. If you feel like you will faint during the procedure, tell your health care provider right away. A paste may be applied to the cauterized area of your cervix to help control bleeding. The sample of cervical tissue will be sent to a lab and looked at under a microscope. The procedure may vary among health care providers and hospitals. What can I expect after the procedure? After the procedure, it is common to have: Mild abdominal cramps that may last for up to 1 week. A small amount of pink-tinged or bloody vaginal discharge, including light to moderate bleeding, for 1-2 weeks. A brown- or black-colored discharge coming from your  vagina, if a paste was used on the cervix to control bleeding. It is up to you to get the results of your procedure. Ask your health care provider, or the department that is  doing the procedure, when your results will be ready. Follow these instructions at home: Take over-the-counter and prescription medicines only as told by your health care provider. Return to your normal activities as told by your health care provider. Ask your health care provider what activities are safe for you. Do not put anything in your vagina for 2 weeks after the procedure or until your health care provider says that it is okay. This includes tampons, creams, and douches. Do not have sex until your health care provider approves. Keep all follow-up visits. This is important. Contact a health care provider if: You have a fever or chills. You feel very weak. You have blood clots or bleeding that is heavier than a normal menstrual period. Bleeding that soaks a pad in less than 1 hour is considered heavy bleeding. You develop a bad-smelling discharge from your vagina. You have severe abdominal pain or cramping. Summary Loop electrosurgical excision procedure (LEEP) is the removal of tissue from the cervix. The removed tissue will be checked for precancerous cells or cancer cells. LEEP typically only takes a few minutes and is often done in your health care provider's office. Do not put anything in your vagina for 2 weeks after the procedure or until your health care provider says that it is okay. This includes tampons, creams, and douches. Ask your health care provider, or the department that is doing the procedure, when your results will be ready. This information is not intended to replace advice given to you by your health care provider. Make sure you discuss any questions you have with your health care provider. Document Revised: 02/27/2021 Document Reviewed: 02/27/2021 Elsevier Patient Education  2024 [employer].

## 2023-06-23 LAB — SURGICAL PATHOLOGY

## 2023-06-24 DIAGNOSIS — F4310 Post-traumatic stress disorder, unspecified: Secondary | ICD-10-CM | POA: Diagnosis not present

## 2023-07-02 DIAGNOSIS — F4310 Post-traumatic stress disorder, unspecified: Secondary | ICD-10-CM | POA: Diagnosis not present

## 2023-07-09 DIAGNOSIS — F4310 Post-traumatic stress disorder, unspecified: Secondary | ICD-10-CM | POA: Diagnosis not present

## 2023-07-20 NOTE — Progress Notes (Unsigned)
    OBSTETRICS/GYNECOLOGY POST-OPERATIVE CLINIC VISIT  Subjective:     Sydney French is a 52 y.o. female who presents to the clinic 4 weeks status post  LEEP  for CIN III . Eating a regular diet {with-without:5700} difficulty. Bowel movements are {normal/abnormal***:19619}. {pain control:13522::"The patient is not having any pain."}  {Common ambulatory SmartLinks:19316}  Review of Systems {ros; complete:30496}   Objective:   There were no vitals taken for this visit. There is no height or weight on file to calculate BMI.  General:  alert and no distress  Abdomen: soft, bowel sounds active, non-tender  Incision:   {incision:13716::"no dehiscence","incision well approximated","healing well","no drainage","no erythema","no hernia","no seroma","no swelling"}    Pathology:    Assessment:   Patient s/p *** (surgery)  {doing well:13525::"Doing well postoperatively."}   Plan:   1. Continue any current medications as instructed by provider. 2. Wound care discussed. 3. Operative findings again reviewed. Pathology report discussed. 4. Activity restrictions: {restrictions:13723} 5. Anticipated return to work: {work return:14002}. 6. Follow up: {8-65:78469} {time; units:18646} for ***    Hildred Laser, MD [facility]

## 2023-07-21 ENCOUNTER — Ambulatory Visit: Payer: BC Managed Care – PPO | Admitting: Obstetrics and Gynecology

## 2023-07-21 ENCOUNTER — Encounter: Payer: Self-pay | Admitting: Obstetrics and Gynecology

## 2023-07-21 VITALS — BP 104/63 | HR 102 | Ht 64.0 in | Wt 113.2 lb

## 2023-07-21 DIAGNOSIS — N879 Dysplasia of cervix uteri, unspecified: Secondary | ICD-10-CM

## 2023-07-22 DIAGNOSIS — F4310 Post-traumatic stress disorder, unspecified: Secondary | ICD-10-CM | POA: Diagnosis not present

## 2023-07-29 DIAGNOSIS — F4310 Post-traumatic stress disorder, unspecified: Secondary | ICD-10-CM | POA: Diagnosis not present

## 2023-08-03 ENCOUNTER — Ambulatory Visit (INDEPENDENT_AMBULATORY_CARE_PROVIDER_SITE_OTHER): Payer: Self-pay | Admitting: Physician Assistant

## 2023-08-03 ENCOUNTER — Other Ambulatory Visit: Payer: Self-pay

## 2023-08-03 ENCOUNTER — Encounter: Payer: Self-pay | Admitting: Physician Assistant

## 2023-08-03 VITALS — BP 90/70 | HR 92 | Temp 97.9°F | Ht 64.0 in | Wt 114.0 lb

## 2023-08-03 DIAGNOSIS — B309 Viral conjunctivitis, unspecified: Secondary | ICD-10-CM

## 2023-08-03 NOTE — Progress Notes (Signed)
[facility] [address] [place], [place] [address]   Office Visit Note  Patient Name: Sydney French Date of Birth [birth year]  Medical Record number [id number]  Date of Service: 08/03/2023  Chief Complaint  Patient presents with   Acute Visit    L eye drainage and crusty x 1-2 weeks     52 y/o F presents to the clinic for c/o left eye with matting when she wakes up in the AM x2wks. She denies known exposure to pink eye. She hasn't used contact lenses for the past 2 weeks. Denies vision changes, headaches, nausea, vomiting, dizziness, photosensitivity.       Current Medication:  Outpatient Encounter Medications as of 08/03/2023  Medication Sig   diphenhydrAMINE HCl (BENADRYL ALLERGY PO) Take by mouth at bedtime as needed.   ibuprofen (ADVIL) 200 MG tablet Take 200 mg by mouth every 6 (six) hours as needed.   Multiple Vitamin (MULTIVITAMIN) tablet Take 1 tablet by mouth daily.   tretinoin (RETIN-A) 0.025 % cream Apply topically at bedtime. (Patient taking differently: Apply topically at bedtime as needed.)   Ferrous Fumarate (HEMOCYTE) 324 (106 Fe) MG TABS tablet Take 1 tablet (106 mg of iron total) by mouth daily. (Patient not taking: Reported on 08/03/2023)   [DISCONTINUED] etodolac (LODINE) 500 MG tablet Take 1 tablet (500 mg total) by mouth 2 (two) times daily.   No facility-administered encounter medications on file as of 08/03/2023.      Medical History: Past Medical History:  Diagnosis Date   Endometriosis    History of ovarian cyst    Irregular periods    Osteoarthritis      Vital Signs: BP 90/70   Pulse 92   Temp 97.9 F (36.6 C)   Ht 5\' 4"  (1.626 m)   Wt 114 lb (51.7 kg)   SpO2 99%   BMI 19.57 kg/m    Review of Systems  Constitutional: Negative.   Eyes:  Positive for discharge (matting in AM). Negative for photophobia, pain, redness, itching and visual disturbance.  Neurological: Negative.     Physical Exam Constitutional:       Appearance: Normal appearance.  HENT:     Head: Atraumatic.     Right Ear: Tympanic membrane, ear canal and external ear normal.     Left Ear: Tympanic membrane, ear canal and external ear normal.     Nose: Nose normal.     Mouth/Throat:     Mouth: Mucous membranes are moist.     Pharynx: Oropharynx is clear.  Eyes:     General:        Right eye: No discharge.        Left eye: No discharge.     Extraocular Movements: Extraocular movements intact.     Right eye: Normal extraocular motion.     Left eye: Normal extraocular motion.     Conjunctiva/sclera: Conjunctivae normal.     Pupils: Pupils are equal, round, and reactive to light.     Comments: Fluorescein stain of left eye: No uptake. No FB visualized.   Cardiovascular:     Rate and Rhythm: Normal rate and regular rhythm.  Pulmonary:     Effort: Pulmonary effort is normal.     Breath sounds: Normal breath sounds.  Musculoskeletal:     Cervical back: Neck supple.  Skin:    General: Skin is warm.  Neurological:     Mental Status: She is alert.  Psychiatric:  Mood and Affect: Mood normal.        Behavior: Behavior normal.        Thought Content: Thought content normal.        Judgment: Judgment normal.       Assessment/Plan:  1. Viral conjunctivitis of left eye  Apply cool compresses to the affected eye lid Consider using saline eye drops Continue to watch for worsening symptoms. May consider anti-histamine eyedrops ie Zaditor if not feeling better. Pt verbalized understanding and in agreement.    General Counseling: eyvette piper understanding of the findings of todays visit and agrees with plan of treatment. I have discussed any further diagnostic evaluation that may be needed or ordered today. We also reviewed her medications today. she has been encouraged to call the office with any questions or concerns that should arise related to todays visit.    Time spent:20 Minutes    Gilberto Better,  [place] Physician Assistant

## 2023-08-05 DIAGNOSIS — F4310 Post-traumatic stress disorder, unspecified: Secondary | ICD-10-CM | POA: Diagnosis not present

## 2023-08-13 DIAGNOSIS — F4310 Post-traumatic stress disorder, unspecified: Secondary | ICD-10-CM | POA: Diagnosis not present

## 2023-08-19 DIAGNOSIS — F4310 Post-traumatic stress disorder, unspecified: Secondary | ICD-10-CM | POA: Diagnosis not present

## 2023-08-27 DIAGNOSIS — F4310 Post-traumatic stress disorder, unspecified: Secondary | ICD-10-CM | POA: Diagnosis not present

## 2023-09-09 DIAGNOSIS — F4310 Post-traumatic stress disorder, unspecified: Secondary | ICD-10-CM | POA: Diagnosis not present

## 2023-09-16 DIAGNOSIS — F4310 Post-traumatic stress disorder, unspecified: Secondary | ICD-10-CM | POA: Diagnosis not present

## 2023-09-23 DIAGNOSIS — F4310 Post-traumatic stress disorder, unspecified: Secondary | ICD-10-CM | POA: Diagnosis not present

## 2023-10-08 DIAGNOSIS — F4310 Post-traumatic stress disorder, unspecified: Secondary | ICD-10-CM | POA: Diagnosis not present

## 2023-10-14 DIAGNOSIS — F4310 Post-traumatic stress disorder, unspecified: Secondary | ICD-10-CM | POA: Diagnosis not present

## 2023-10-20 DIAGNOSIS — D2261 Melanocytic nevi of right upper limb, including shoulder: Secondary | ICD-10-CM | POA: Diagnosis not present

## 2023-10-20 DIAGNOSIS — D2272 Melanocytic nevi of left lower limb, including hip: Secondary | ICD-10-CM | POA: Diagnosis not present

## 2023-10-20 DIAGNOSIS — D225 Melanocytic nevi of trunk: Secondary | ICD-10-CM | POA: Diagnosis not present

## 2023-10-20 DIAGNOSIS — D2262 Melanocytic nevi of left upper limb, including shoulder: Secondary | ICD-10-CM | POA: Diagnosis not present

## 2023-10-21 DIAGNOSIS — F4310 Post-traumatic stress disorder, unspecified: Secondary | ICD-10-CM | POA: Diagnosis not present

## 2023-10-28 DIAGNOSIS — F4310 Post-traumatic stress disorder, unspecified: Secondary | ICD-10-CM | POA: Diagnosis not present

## 2023-11-04 DIAGNOSIS — F4310 Post-traumatic stress disorder, unspecified: Secondary | ICD-10-CM | POA: Diagnosis not present

## 2023-11-19 DIAGNOSIS — F4322 Adjustment disorder with anxiety: Secondary | ICD-10-CM | POA: Diagnosis not present

## 2023-12-10 ENCOUNTER — Other Ambulatory Visit: Payer: Self-pay

## 2023-12-10 ENCOUNTER — Encounter: Payer: Self-pay | Admitting: Adult Health

## 2023-12-10 ENCOUNTER — Ambulatory Visit: Payer: Self-pay | Admitting: Adult Health

## 2023-12-10 VITALS — BP 100/79 | HR 87 | Temp 98.0°F | Ht 64.0 in

## 2023-12-10 DIAGNOSIS — M25562 Pain in left knee: Secondary | ICD-10-CM

## 2023-12-10 DIAGNOSIS — R21 Rash and other nonspecific skin eruption: Secondary | ICD-10-CM

## 2023-12-10 MED ORDER — PREDNISONE 10 MG PO TABS: ORAL_TABLET | ORAL | 0 refills | Status: AC

## 2023-12-10 NOTE — Progress Notes (Signed)
 [facility] [address] [place], [place] [address] Phone: [phone] Fax: [phone]   Office Visit Note  Patient Name: Sydney French  Date of ONGEX:[id number]  Med Rec number [id number]  Date of Service: 12/10/2023  Patient has no known allergies.  Chief Complaint  Patient presents with   Rash    Patient c/o red rash on her face for the last week. She thought it may be a reaction to working outside with pine needles. She has tried Oil of Olay moisturizer and vaseline and has been using Cetaphil gentle cleanser and Eucerin baby eczema relief body cream but symptoms have worsened over time. She also tried hydrocortisone cream 4 days ago. Patient also c/o paoin behind her L knee x 1 week. She fell about one month ago and injured the same leg but is not sure if this is associated with her current sy     HPI  Patient is here for rash on her face that she noticed 5 days ago.  Through the week it has become more obvious. She describes it has mildly itchy.  Denies history of eczema/psoriasis. She was hospitalized as an infant for a contact dermatitis, but no issues since.  She was working outside spreading pine needles on the day this started.  She has used a few otc creams and some hydrocortisone without much improvement.   Current Medication:  Outpatient Encounter Medications as of 12/10/2023  Medication Sig   diphenhydrAMINE HCl (BENADRYL ALLERGY PO) Take by mouth at bedtime as needed.   Ferrous Sulfate (IRON PO) Take by mouth daily. OTC   ibuprofen (ADVIL) 200 MG tablet Take 200 mg by mouth every 6 (six) hours as needed.   Multiple Vitamin (MULTIVITAMIN) tablet Take 1 tablet by mouth daily.   predniSONE (DELTASONE) 10 MG tablet Take 6 tablets (60 mg total) by mouth daily with breakfast for 1 day, THEN 5 tablets (50 mg total) daily with breakfast for 1 day, THEN 4 tablets (40 mg total) daily with breakfast for 1 day, THEN 3 tablets (30 mg total) daily with breakfast for 1  day, THEN 2 tablets (20 mg total) daily with breakfast for 1 day, THEN 1 tablet (10 mg total) daily with breakfast for 1 day.   tretinoin (RETIN-A) 0.025 % cream Apply topically at bedtime. (Patient taking differently: Apply topically at bedtime as needed.)   Ferrous Fumarate (HEMOCYTE) 324 (106 Fe) MG TABS tablet Take 1 tablet (106 mg of iron total) by mouth daily. (Patient not taking: Reported on 12/10/2023)   No facility-administered encounter medications on file as of 12/10/2023.      Medical History: Past Medical History:  Diagnosis Date   Endometriosis    History of ovarian cyst    Irregular periods    Osteoarthritis      Vital Signs: BP 100/79   Pulse 87   Temp 98 F (36.7 C)   Ht 5\' 4"  (1.626 m)   SpO2 99%   BMI 19.57 kg/m    Review of Systems  Musculoskeletal:  Positive for arthralgias.  Skin:  Positive for rash.    Physical Exam Vitals reviewed.  Constitutional:      Appearance: Normal appearance.  Musculoskeletal:       Legs:     Comments: Mild discomfort with palpation at above site, posterior left knee.  No mass or knot appreciated. Lower extremities are normothermic, and normo chromatic.   Skin:    Comments: Face and chin are mildly puffy with some  erythema, and dry patchy areas noted.   Neurological:     Mental Status: She is alert.     Assessment/Plan: 1. Facial rash (Primary) Take prednisone as prescribed.  Continue daily antihistamine like Claritin.  Make appt to see derm, can cancel if rash clears.  - predniSONE (DELTASONE) 10 MG tablet; Take 6 tablets (60 mg total) by mouth daily with breakfast for 1 day, THEN 5 tablets (50 mg total) daily with breakfast for 1 day, THEN 4 tablets (40 mg total) daily with breakfast for 1 day, THEN 3 tablets (30 mg total) daily with breakfast for 1 day, THEN 2 tablets (20 mg total) daily with breakfast for 1 day, THEN 1 tablet (10 mg total) daily with breakfast for 1 day.  Dispense: 21 tablet; Refill: 0  2.  Posterior knee pain, left Take ibuprofen, and rest.  Discussed ultrasound if discomfort continues or worsens.      General Counseling: andrell tallman understanding of the findings of todays visit and agrees with plan of treatment. I have discussed any further diagnostic evaluation that may be needed or ordered today. We also reviewed her medications today. she has been encouraged to call the office with any questions or concerns that should arise related to todays visit.   No orders of the defined types were placed in this encounter.   Meds ordered this encounter  Medications   predniSONE (DELTASONE) 10 MG tablet    Sig: Take 6 tablets (60 mg total) by mouth daily with breakfast for 1 day, THEN 5 tablets (50 mg total) daily with breakfast for 1 day, THEN 4 tablets (40 mg total) daily with breakfast for 1 day, THEN 3 tablets (30 mg total) daily with breakfast for 1 day, THEN 2 tablets (20 mg total) daily with breakfast for 1 day, THEN 1 tablet (10 mg total) daily with breakfast for 1 day.    Dispense:  21 tablet    Refill:  0    Time spent:15 Minutes Time spent includes review of chart, medications, test results, and follow up plan with the patient.    Johnna Acosta AGNP-C Nurse Practitioner

## 2023-12-23 ENCOUNTER — Other Ambulatory Visit: Payer: Self-pay | Admitting: Nurse Practitioner

## 2023-12-23 DIAGNOSIS — Z1231 Encounter for screening mammogram for malignant neoplasm of breast: Secondary | ICD-10-CM

## 2023-12-31 ENCOUNTER — Ambulatory Visit (INDEPENDENT_AMBULATORY_CARE_PROVIDER_SITE_OTHER): Payer: Self-pay | Admitting: Adult Health

## 2023-12-31 ENCOUNTER — Encounter: Payer: Self-pay | Admitting: Adult Health

## 2023-12-31 ENCOUNTER — Other Ambulatory Visit: Payer: Self-pay

## 2023-12-31 VITALS — BP 110/60 | HR 97 | Temp 97.3°F | Ht 64.0 in | Wt 114.0 lb

## 2023-12-31 DIAGNOSIS — G8929 Other chronic pain: Secondary | ICD-10-CM

## 2023-12-31 DIAGNOSIS — M25562 Pain in left knee: Secondary | ICD-10-CM

## 2023-12-31 DIAGNOSIS — Z1211 Encounter for screening for malignant neoplasm of colon: Secondary | ICD-10-CM

## 2023-12-31 NOTE — Progress Notes (Addendum)
 [facility] [address] [place], [place] [address]   Office Visit Note  Patient Name: Sydney French Date of Birth [id number]  Medical Record number [id number]  Date of Service: 12/31/2023  Chief Complaint  Patient presents with   Knee Pain    Patient reports having intermittent L knee pain for the past 3 weeks. The pain is mostly behind the knee. She feel on ice in January and landed on this knee.      HPI Pt is here for a sick visit. She reports continued Left knee pain.  She Has taken Aleve or Advil.  This has not seemed to help.  She describes the pain as intermittent.   Also its time for her colonoscopy. She is requesting referral.    Current Medication:  Outpatient Encounter Medications as of 12/31/2023  Medication Sig   diphenhydrAMINE HCl (BENADRYL ALLERGY PO) Take by mouth at bedtime as needed.   Ferrous Sulfate (IRON PO) Take by mouth daily. OTC   ibuprofen (ADVIL) 200 MG tablet Take 200 mg by mouth every 6 (six) hours as needed.   Multiple Vitamin (MULTIVITAMIN) tablet Take 1 tablet by mouth daily.   tretinoin (RETIN-A) 0.025 % cream Apply topically at bedtime. (Patient taking differently: Apply topically at bedtime as needed.)   Ferrous Fumarate (HEMOCYTE) 324 (106 Fe) MG TABS tablet Take 1 tablet (106 mg of iron total) by mouth daily. (Patient not taking: Reported on 12/31/2023)   No facility-administered encounter medications on file as of 12/31/2023.      Medical History: Past Medical History:  Diagnosis Date   Endometriosis    History of ovarian cyst    Irregular periods    Osteoarthritis      Vital Signs: BP 110/60   Pulse 97   Temp (!) 97.3 F (36.3 C)   Ht 5\' 4"  (1.626 m)   Wt 114 lb (51.7 kg)   SpO2 99%   BMI 19.57 kg/m    Review of Systems  Constitutional:  Negative for chills, fatigue and fever.  Cardiovascular:  Negative for chest pain and leg swelling.  Musculoskeletal:  Positive for arthralgias.    Physical Exam Vitals  reviewed.  Constitutional:      Appearance: Normal appearance.  Musculoskeletal:       Legs:     Comments: Continued pain left posterior knee.   Neurological:     Mental Status: She is alert.    Assessment/Plan: 1. Chronic pain of left knee (Primary) Referral to ortho in [place].    2. Screen for colon cancer Per patient request. - Ambulatory referral to Gastroenterology     General Counseling: hydee fleece understanding of the findings of todays visit and agrees with plan of treatment. I have discussed any further diagnostic evaluation that may be needed or ordered today. We also reviewed her medications today. she has been encouraged to call the office with any questions or concerns that should arise related to todays visit.   No orders of the defined types were placed in this encounter.   No orders of the defined types were placed in this encounter.   Time spent:15 Minutes    Johnna Acosta AGNP-C Nurse Practitioner

## 2024-01-01 DIAGNOSIS — S83242A Other tear of medial meniscus, current injury, left knee, initial encounter: Secondary | ICD-10-CM | POA: Diagnosis not present

## 2024-01-01 DIAGNOSIS — M25562 Pain in left knee: Secondary | ICD-10-CM | POA: Diagnosis not present

## 2024-01-14 ENCOUNTER — Encounter: Payer: Self-pay | Admitting: Radiology

## 2024-01-14 ENCOUNTER — Ambulatory Visit
Admission: RE | Admit: 2024-01-14 | Discharge: 2024-01-14 | Disposition: A | Discharge status: Home / Self Care | Source: Ambulatory Visit | Attending: Nurse Practitioner | Admitting: Nurse Practitioner

## 2024-01-14 DIAGNOSIS — Z1211 Encounter for screening for malignant neoplasm of colon: Secondary | ICD-10-CM | POA: Diagnosis not present

## 2024-01-14 DIAGNOSIS — Z1231 Encounter for screening mammogram for malignant neoplasm of breast: Secondary | ICD-10-CM | POA: Diagnosis not present

## 2024-01-14 DIAGNOSIS — K5900 Constipation, unspecified: Secondary | ICD-10-CM | POA: Diagnosis not present

## 2024-01-14 DIAGNOSIS — G4700 Insomnia, unspecified: Secondary | ICD-10-CM | POA: Diagnosis not present

## 2024-01-19 ENCOUNTER — Other Ambulatory Visit: Payer: Self-pay | Admitting: Student

## 2024-01-19 DIAGNOSIS — M25562 Pain in left knee: Secondary | ICD-10-CM

## 2024-01-19 DIAGNOSIS — S83242A Other tear of medial meniscus, current injury, left knee, initial encounter: Secondary | ICD-10-CM

## 2024-01-25 ENCOUNTER — Other Ambulatory Visit

## 2024-01-29 ENCOUNTER — Other Ambulatory Visit

## 2024-02-01 DIAGNOSIS — M222X2 Patellofemoral disorders, left knee: Secondary | ICD-10-CM | POA: Diagnosis not present

## 2024-02-01 DIAGNOSIS — M67962 Unspecified disorder of synovium and tendon, left lower leg: Secondary | ICD-10-CM | POA: Diagnosis not present

## 2024-02-10 DIAGNOSIS — K621 Rectal polyp: Secondary | ICD-10-CM | POA: Diagnosis not present

## 2024-02-10 DIAGNOSIS — Z1211 Encounter for screening for malignant neoplasm of colon: Secondary | ICD-10-CM | POA: Diagnosis not present

## 2024-02-23 DIAGNOSIS — S83242A Other tear of medial meniscus, current injury, left knee, initial encounter: Secondary | ICD-10-CM | POA: Diagnosis not present

## 2024-02-23 DIAGNOSIS — S86112A Strain of other muscle(s) and tendon(s) of posterior muscle group at lower leg level, left leg, initial encounter: Secondary | ICD-10-CM | POA: Diagnosis not present

## 2024-02-23 DIAGNOSIS — M25562 Pain in left knee: Secondary | ICD-10-CM | POA: Diagnosis not present

## 2024-02-24 DIAGNOSIS — F4310 Post-traumatic stress disorder, unspecified: Secondary | ICD-10-CM | POA: Diagnosis not present

## 2024-02-26 ENCOUNTER — Encounter: Payer: BC Managed Care – PPO | Admitting: Nurse Practitioner

## 2024-02-26 ENCOUNTER — Telehealth: Payer: Self-pay | Admitting: Nurse Practitioner

## 2024-02-26 ENCOUNTER — Ambulatory Visit: Payer: BC Managed Care – PPO | Admitting: Nurse Practitioner

## 2024-02-26 ENCOUNTER — Encounter: Payer: Self-pay | Admitting: Nurse Practitioner

## 2024-02-26 VITALS — BP 108/78 | HR 87 | Temp 98.1°F | Resp 16 | Ht 64.0 in | Wt 111.0 lb

## 2024-02-26 DIAGNOSIS — Z124 Encounter for screening for malignant neoplasm of cervix: Secondary | ICD-10-CM | POA: Diagnosis not present

## 2024-02-26 DIAGNOSIS — L659 Nonscarring hair loss, unspecified: Secondary | ICD-10-CM | POA: Diagnosis not present

## 2024-02-26 DIAGNOSIS — Z113 Encounter for screening for infections with a predominantly sexual mode of transmission: Secondary | ICD-10-CM

## 2024-02-26 DIAGNOSIS — E538 Deficiency of other specified B group vitamins: Secondary | ICD-10-CM | POA: Diagnosis not present

## 2024-02-26 DIAGNOSIS — Z0001 Encounter for general adult medical examination with abnormal findings: Secondary | ICD-10-CM

## 2024-02-26 DIAGNOSIS — D509 Iron deficiency anemia, unspecified: Secondary | ICD-10-CM | POA: Diagnosis not present

## 2024-02-26 DIAGNOSIS — E782 Mixed hyperlipidemia: Secondary | ICD-10-CM | POA: Diagnosis not present

## 2024-02-26 DIAGNOSIS — E559 Vitamin D deficiency, unspecified: Secondary | ICD-10-CM

## 2024-02-26 NOTE — Telephone Encounter (Signed)
 Patient did not want to schedule f/u for lab results. She stated she will just see results on mychart. If anything concerning, she will call office-Toni

## 2024-02-26 NOTE — Progress Notes (Signed)
 [facility] [address] [place], [place] [address]  Internal MEDICINE  Office Visit Note  Patient Name: Sydney French  [id number]  [id number]  Date of Service: 02/26/2024  Chief Complaint  Patient presents with   Annual Exam    HPI Marnita presents for an annual well visit and physical exam.  Well-appearing 53 y.o. female with arthritis and history of anemia.  History of abnormal pap smears -- repeated unsatisfactory result Routine CRC screening: recent colonoscopy done, found 1 polyp Routine mammogram: mammogram was done in April and was normal  Pap smear: pap done today per patient request. Then will see OBGYN to follow up  Labs:  routine labs due now  New or worsening pain: fell on ice in January, seen orthopedic for left knee pain, recently had MRI done, looking to see if there is a tear.  Other concerns: none    Current Medication: Outpatient Encounter Medications as of 02/26/2024  Medication Sig   diphenhydrAMINE HCl (BENADRYL ALLERGY PO) Take by mouth at bedtime as needed.   Ferrous Fumarate  (HEMOCYTE) 324 (106 Fe) MG TABS tablet Take 1 tablet (106 mg of iron total) by mouth daily.   Ferrous Sulfate (IRON PO) Take by mouth daily. OTC   ibuprofen (ADVIL) 200 MG tablet Take 200 mg by mouth every 6 (six) hours as needed.   Multiple Vitamin (MULTIVITAMIN) tablet Take 1 tablet by mouth daily.   tretinoin  (RETIN-A ) 0.025 % cream Apply topically at bedtime.   No facility-administered encounter medications on file as of 02/26/2024.    Surgical History: Past Surgical History:  Procedure Laterality Date   CESAREAN SECTION     child birth natural     1995, 1998, 2003   CHOLECYSTECTOMY  43   DILATION AND CURETTAGE OF UTERUS     GALLBLADDER SURGERY      Medical History: Past Medical History:  Diagnosis Date   Endometriosis    History of ovarian cyst    Irregular periods    Osteoarthritis     Family History: Family History  Problem Relation Age of  Onset   Melanoma Mother    Cancer Mother    Heart attack Father    Melanoma Brother    Asthma Neg Hx    Breast cancer Neg Hx    Stroke Neg Hx    Glaucoma Neg Hx    Arthritis Neg Hx     Social History   Socioeconomic History   Marital status: Married    Spouse name: Not on file   Number of children: Not on file   Years of education: Not on file   Highest education level: Not on file  Occupational History   Not on file  Tobacco Use   Smoking status: Never   Smokeless tobacco: Never  Vaping Use   Vaping status: Not on file  Substance and Sexual Activity   Alcohol use: No   Drug use: No   Sexual activity: Yes  Other Topics Concern   Not on file  Social History Narrative   Not on file   Social Drivers of Health   Financial Resource Strain: Low Risk  (01/01/2024)   Received from [facility]   Overall Financial Resource Strain (CARDIA)    Difficulty of Paying Living Expenses: Not hard at all  Food Insecurity: No Food Insecurity (01/01/2024)   Received from [facility]   Hunger Vital Sign    Within the past 12 months, you worried that  your food would run out before you got the money to buy more.: Never true    Within the past 12 months, the food you bought just didn't last and you didn't have money to get more.: Never true  Transportation Needs: No Transportation Needs (01/01/2024)   Received from [facility] - Transportation    In the past 12 months, has lack of transportation kept you from medical appointments or from getting medications?: No    Lack of Transportation (Non-Medical): No  Physical Activity: Not on file  Stress: Not on file  Social Connections: Not on file  Intimate Partner Violence: Not on file      Review of Systems  Constitutional:  Negative for activity change, appetite change, chills, fatigue, fever and unexpected weight change.  HENT: Negative.  Negative for congestion, ear pain,  rhinorrhea, sore throat and trouble swallowing.   Eyes: Negative.   Respiratory: Negative.  Negative for cough, chest tightness, shortness of breath and wheezing.   Cardiovascular: Negative.  Negative for chest pain.  Gastrointestinal: Negative.  Negative for abdominal pain, blood in stool, constipation, diarrhea, nausea and vomiting.  Endocrine: Negative.   Genitourinary: Negative.  Negative for difficulty urinating, dysuria, frequency, hematuria and urgency.  Musculoskeletal: Negative.  Negative for arthralgias, back pain, joint swelling, myalgias and neck pain.  Skin: Negative.  Negative for rash and wound.  Allergic/Immunologic: Negative.  Negative for immunocompromised state.  Neurological: Negative.  Negative for dizziness, seizures, numbness and headaches.  Hematological: Negative.   Psychiatric/Behavioral: Negative.  Negative for behavioral problems, self-injury and suicidal ideas. The patient is not nervous/anxious.     Vital Signs: BP 108/78   Pulse 87   Temp 98.1 F (36.7 C)   Resp 16   Ht 5' 4 (1.626 m)   Wt 111 lb (50.3 kg)   SpO2 98%   BMI 19.05 kg/m    Physical Exam Vitals reviewed.  Constitutional:      General: She is awake. She is not in acute distress.    Appearance: Normal appearance. She is well-developed, well-groomed and normal weight. She is not ill-appearing or diaphoretic.  HENT:     Head: Normocephalic and atraumatic.     Right Ear: Tympanic membrane, ear canal and external ear normal. There is no impacted cerumen.     Left Ear: External ear normal. There is impacted cerumen.     Nose: Nose normal. No congestion or rhinorrhea.     Mouth/Throat:     Lips: Pink.     Mouth: Mucous membranes are moist.     Pharynx: Oropharynx is clear. Uvula midline. No oropharyngeal exudate or posterior oropharyngeal erythema.   Eyes:     General: Lids are normal. Vision grossly intact. Gaze aligned appropriately. No scleral icterus.       Right eye: No discharge.         Left eye: No discharge.     Extraocular Movements: Extraocular movements intact.     Conjunctiva/sclera: Conjunctivae normal.     Pupils: Pupils are equal, round, and reactive to light.     Funduscopic exam:    Right eye: Red reflex present.        Left eye: Red reflex present.  Neck:     Thyroid : No thyromegaly.     Vascular: No JVD.     Trachea: Trachea and phonation normal. No tracheal deviation.   Cardiovascular:     Rate and Rhythm: Normal rate and regular rhythm.  Pulses: Normal pulses.     Heart sounds: Normal heart sounds, S1 normal and S2 normal. No murmur heard.    No friction rub. No gallop.  Pulmonary:     Effort: Pulmonary effort is normal. No accessory muscle usage or respiratory distress.     Breath sounds: Normal breath sounds and air entry. No stridor. No wheezing or rales.  Chest:     Chest wall: No tenderness.     Comments: Declined clinical breast exam.  Abdominal:     General: Bowel sounds are normal. There is no distension.     Palpations: Abdomen is soft. There is no mass.     Tenderness: There is no abdominal tenderness. There is no guarding or rebound.     Hernia: There is no hernia in the left inguinal area or right inguinal area.  Genitourinary:    General: Normal vulva.     Exam position: Lithotomy position.     Labia:        Right: No rash, tenderness, lesion or injury.        Left: No rash, tenderness, lesion or injury.      Urethra: No prolapse, urethral swelling or urethral lesion.     Vagina: Normal. No signs of injury and foreign body. No vaginal discharge, erythema, tenderness, bleeding, lesions or prolapsed vaginal walls.     Cervix: No cervical motion tenderness, discharge, friability, lesion, erythema, cervical bleeding or eversion.     Uterus: Normal. Not deviated, not enlarged, not fixed, not tender and no uterine prolapse.      Adnexa: Right adnexa normal and left adnexa normal.       Right: No mass, tenderness or fullness.          Left: No mass, tenderness or fullness.       Rectum: Normal. No anal fissure or external hemorrhoid. Normal anal tone.     Comments: Cervical os not visualized   Musculoskeletal:        General: No tenderness or deformity. Normal range of motion.     Cervical back: Normal range of motion and neck supple.     Right lower leg: No edema.     Left lower leg: No edema.  Lymphadenopathy:     Cervical: No cervical adenopathy.     Lower Body: No right inguinal adenopathy. No left inguinal adenopathy.   Skin:    General: Skin is warm and dry.     Capillary Refill: Capillary refill takes less than 2 seconds.     Coloration: Skin is not pale.     Findings: No erythema or rash.   Neurological:     Mental Status: She is alert and oriented to person, place, and time.     Cranial Nerves: No cranial nerve deficit.     Motor: No abnormal muscle tone.     Coordination: Coordination normal.     Deep Tendon Reflexes: Reflexes are normal and symmetric.   Psychiatric:        Mood and Affect: Mood and affect normal.        Behavior: Behavior normal. Behavior is cooperative.        Thought Content: Thought content normal.        Judgment: Judgment normal.        Assessment/Plan: 1. Encounter for routine adult health examination with abnormal findings (Primary) Age-appropriate preventive screenings and vaccinations discussed, annual physical exam completed. Routine labs for health maintenance ordered, see below. PHM updated.   - CBC  with Differential/Platelet - CMP14+EGFR - Lipid Profile - Vitamin D  (25 hydroxy) - B12 and Folate Panel - Iron, TIBC and Ferritin Panel - TSH + free T4  2. Iron deficiency anemia, unspecified iron deficiency anemia type Routine labs ordered  - CBC with Differential/Platelet - B12 and Folate Panel - Iron, TIBC and Ferritin Panel  3. Mixed hyperlipidemia Routine labs ordered  - CMP14+EGFR - Lipid Profile - TSH + free T4  4. Hair loss Routine labs  ordered  - CBC with Differential/Platelet - CMP14+EGFR - Lipid Profile - Vitamin D  (25 hydroxy) - B12 and Folate Panel - Iron, TIBC and Ferritin Panel - TSH + free T4  5. B12 deficiency Routine labs ordered  - CBC with Differential/Platelet - B12 and Folate Panel - Iron, TIBC and Ferritin Panel  6. Vitamin D  deficiency Routine labs ordered  - Vitamin D  (25 hydroxy)  7. Encounter for screening for malignant neoplasm of cervix Routine pelvic exam with pap smear done.  - IGP, Aptima HPV  8. Screening for STDs (sexually transmitted diseases) Cervical swab for STD screening sent to lab - NuSwab Vaginitis Plus (VG+)     General Counseling: amadea keagy understanding of the findings of todays visit and agrees with plan of treatment. I have discussed any further diagnostic evaluation that may be needed or ordered today. We also reviewed her medications today. she has been encouraged to call the office with any questions or concerns that should arise related to todays visit.    Orders Placed This Encounter  Procedures   CBC with Differential/Platelet   CMP14+EGFR   Lipid Profile   Vitamin D  (25 hydroxy)   B12 and Folate Panel   Iron, TIBC and Ferritin Panel   TSH + free T4   NuSwab Vaginitis Plus (VG+)    No orders of the defined types were placed in this encounter.   Return for F/U, Labs, Medhansh Brinkmeier PCP.   Total time spent:30 Minutes Time spent includes review of chart, medications, test results, and follow up plan with the patient.   [place] Controlled Substance Database was reviewed by me.  This patient was seen by Mardy Maxin, FNP-C in collaboration with Dr. Sigrid Bathe as a part of collaborative care agreement.  Patrik Turnbaugh R. Maxin, MSN, FNP-C Internal medicine

## 2024-03-01 DIAGNOSIS — D509 Iron deficiency anemia, unspecified: Secondary | ICD-10-CM | POA: Diagnosis not present

## 2024-03-01 DIAGNOSIS — E782 Mixed hyperlipidemia: Secondary | ICD-10-CM | POA: Diagnosis not present

## 2024-03-01 DIAGNOSIS — L659 Nonscarring hair loss, unspecified: Secondary | ICD-10-CM | POA: Diagnosis not present

## 2024-03-01 DIAGNOSIS — E538 Deficiency of other specified B group vitamins: Secondary | ICD-10-CM | POA: Diagnosis not present

## 2024-03-01 DIAGNOSIS — Z0001 Encounter for general adult medical examination with abnormal findings: Secondary | ICD-10-CM | POA: Diagnosis not present

## 2024-03-01 DIAGNOSIS — E559 Vitamin D deficiency, unspecified: Secondary | ICD-10-CM | POA: Diagnosis not present

## 2024-03-01 LAB — NUSWAB VAGINITIS PLUS (VG+)
Candida albicans, NAA: NEGATIVE
Candida glabrata, NAA: NEGATIVE

## 2024-03-02 DIAGNOSIS — F4310 Post-traumatic stress disorder, unspecified: Secondary | ICD-10-CM | POA: Diagnosis not present

## 2024-03-02 LAB — LIPID PANEL
Chol/HDL Ratio: 3 ratio (ref 0.0–4.4)
Cholesterol, Total: 208 mg/dL — ABNORMAL HIGH (ref 100–199)
HDL: 70 mg/dL (ref >39)
LDL Chol Calc (NIH): 124 mg/dL — ABNORMAL HIGH (ref 0–99)
Triglycerides: 79 mg/dL (ref 0–149)
VLDL Cholesterol Cal: 14 mg/dL (ref 5–40)

## 2024-03-02 LAB — CBC WITH DIFFERENTIAL/PLATELET
Basophils Absolute: 0.1 10*3/uL (ref 0.0–0.2)
Basos: 1 %
EOS (ABSOLUTE): 0.1 10*3/uL (ref 0.0–0.4)
Eos: 2 %
Hematocrit: 39.9 % (ref 34.0–46.6)
Hemoglobin: 12.7 g/dL (ref 11.1–15.9)
Immature Grans (Abs): 0 10*3/uL (ref 0.0–0.1)
Immature Granulocytes: 0 %
Lymphocytes Absolute: 2.3 10*3/uL (ref 0.7–3.1)
Lymphs: 37 %
MCH: 30.2 pg (ref 26.6–33.0)
MCHC: 31.8 g/dL (ref 31.5–35.7)
MCV: 95 fL (ref 79–97)
Monocytes Absolute: 0.4 10*3/uL (ref 0.1–0.9)
Monocytes: 7 %
Neutrophils Absolute: 3.4 10*3/uL (ref 1.4–7.0)
Neutrophils: 53 %
Platelets: 248 10*3/uL (ref 150–450)
RBC: 4.21 x10E6/uL (ref 3.77–5.28)
RDW: 13.2 % (ref 11.7–15.4)
WBC: 6.2 10*3/uL (ref 3.4–10.8)

## 2024-03-02 LAB — IRON,TIBC AND FERRITIN PANEL
Ferritin: 88 ng/mL (ref 15–150)
Iron Saturation: 17 % (ref 15–55)
Iron: 61 ug/dL (ref 27–159)
Total Iron Binding Capacity: 357 ug/dL (ref 250–450)
UIBC: 296 ug/dL (ref 131–425)

## 2024-03-02 LAB — CMP14+EGFR
ALT: 25 IU/L (ref 0–32)
AST: 22 IU/L (ref 0–40)
Albumin: 4.7 g/dL (ref 3.8–4.9)
Alkaline Phosphatase: 70 IU/L (ref 44–121)
BUN/Creatinine Ratio: 14 (ref 9–23)
BUN: 9 mg/dL (ref 6–24)
Bilirubin Total: 0.4 mg/dL (ref 0.0–1.2)
CO2: 22 mmol/L (ref 20–29)
Calcium: 9.4 mg/dL (ref 8.7–10.2)
Chloride: 95 mmol/L — ABNORMAL LOW (ref 96–106)
Creatinine, Ser: 0.64 mg/dL (ref 0.57–1.00)
Globulin, Total: 3.3 g/dL (ref 1.5–4.5)
Glucose: 92 mg/dL (ref 70–99)
Potassium: 3.9 mmol/L (ref 3.5–5.2)
Sodium: 132 mmol/L — ABNORMAL LOW (ref 134–144)
Total Protein: 8 g/dL (ref 6.0–8.5)
eGFR: 104 mL/min/{1.73_m2} (ref >59)

## 2024-03-02 LAB — B12 AND FOLATE PANEL
Folate: >20 ng/mL (ref >3.0)
Vitamin B-12: 963 pg/mL (ref 232–1245)

## 2024-03-02 LAB — VITAMIN D 25 HYDROXY (VIT D DEFICIENCY, FRACTURES): Vit D, 25-Hydroxy: 40.7 ng/mL (ref 30.0–100.0)

## 2024-03-02 LAB — TSH+FREE T4
Free T4: 1.24 ng/dL (ref 0.82–1.77)
TSH: 0.787 u[IU]/mL (ref 0.450–4.500)

## 2024-03-03 ENCOUNTER — Encounter: Payer: Self-pay | Admitting: Nurse Practitioner

## 2024-03-03 LAB — IGP, APTIMA HPV: HPV Aptima: NEGATIVE

## 2024-03-04 DIAGNOSIS — M25562 Pain in left knee: Secondary | ICD-10-CM | POA: Diagnosis not present

## 2024-03-09 DIAGNOSIS — M25562 Pain in left knee: Secondary | ICD-10-CM | POA: Diagnosis not present

## 2024-03-10 DIAGNOSIS — F4310 Post-traumatic stress disorder, unspecified: Secondary | ICD-10-CM | POA: Diagnosis not present

## 2024-03-11 NOTE — Progress Notes (Signed)
 Referring Provider:  Dr. Denman Fischer  HPI:  Sydney French is a 53 y.o.  G7P0020  who presents today for evaluation and management of abnormal cervical cytology.  History of pap smears being unsatisfactory. Prior colposcopies with HSIL, then subsequent LEEPs with CIN1 only. Follow up paps unsatisfactory. Pt also with vaginal dryness and dyspareunia. Also with RLQ pain that has been there for a while. No recent pelvic US  since COVID. Recently had a clean colonoscopy.  Prior pap smears:  Date:02/26/24 AOZ:[id number]   HPV: NEGATIVE  Date:05/05/23 MWU:[id number]  Date:02/24/23 UYQ:[id number]  IEP:PIRJJOAC Date: 02/21/22 Pap: UNSATISFACTORY  Date: 01/09/22 Pap: UNSATISFACTORY HPV: NEG Date: 11/28/21 Pap: UNSATISFACTORY HPV: NEG  Prior cervical / vaginal findings: Colpo Date:05/05/23 Results: 9:00 HG SIL; ECC: mucin only, non-diagnostic Date:04/09/22 Results: 6:00bx (HSIL, CIN-3) ZYS:AYTKZS endocervical epithelium showing atrophy   Prior cervical treatment(s): LEEP Date: 06/22/23 LEEP Results: CIN 1; ECC neg Date:04/28/22 LEEP Results:CIN 1, ECC: no proper endocervical tissue seen for eval  Symptoms/History:  -Abnormal vaginal discharge: no -Postmenopausal: yes -Intermenstrual bleeding: no -Postcoital bleeding: no -Bleeding problems (non-gyn): no -Contraception: none  -Number of current sexual partners: 1 -Number of partners in lifetime: 1 -History of a high risk partner: no -History of STDs: no -Smoking: no -Gardasil Vaccine: no      ROS:  Pertinent items are noted in HPI.  OB History  Gravida Para Term Preterm AB Living  7 5   2    SAB IAB Ectopic Multiple Live Births  2        # Outcome Date GA Lbr Len/2nd Weight Sex Type Anes PTL Lv  7 Para 2003     Vag-Spont     6 Para 2002     Vag-Spont     5 Para 1998     Vag-Spont     4 SAB 1997          3 Para 1995     Vag-Spont     2 Para 1993     CS-Unspec     1 SAB             Past Medical History:  Diagnosis Date    Endometriosis    History of ovarian cyst    Irregular periods    Osteoarthritis     Past Surgical History:  Procedure Laterality Date   CESAREAN SECTION     child birth natural     1995, 1998, 2003   CHOLECYSTECTOMY  60   DILATION AND CURETTAGE OF UTERUS     GALLBLADDER SURGERY      SOCIAL HISTORY:  Social History   Substance and Sexual Activity  Alcohol Use No    Social History   Substance and Sexual Activity  Drug Use No     Family History  Problem Relation Age of Onset   Melanoma Mother    Cancer Mother    Heart attack Father    Melanoma Brother    Asthma Neg Hx    Breast cancer Neg Hx    Stroke Neg Hx    Glaucoma Neg Hx    Arthritis Neg Hx     ALLERGIES:  Patient has no known allergies.  She has a current medication list which includes the following prescription(s): estradiol , diphenhydramine hcl, ferrous fumarate , ferrous sulfate, ibuprofen, multivitamin, and tretinoin .  Physical Exam: -Vitals:  BP 107/63   Pulse 97   Ht 5\' 4"  (1.626 m)   Wt 110 lb (49.9 kg)   BMI 18.88 kg/m  PROCEDURE: Colposcopy performed with 4% acetic acid and Lugol's after informed consent obtained.  Physical Exam Vitals and nursing note reviewed. Exam conducted with a chaperone present.  Constitutional:      Appearance: Normal appearance.  Eyes:     Extraocular Movements: Extraocular movements intact.  Pulmonary:     Effort: Pulmonary effort is normal.  Abdominal:     General: Abdomen is flat.  Genitourinary:    Vagina: Tenderness (atrophic mucosa) present.     Cervix: No friability, lesion (os stenotic and scarred) or erythema.  Neurological:     General: No focal deficit present.     Mental Status: She is alert.  Psychiatric:        Mood and Affect: Mood normal.                               -Aceto-white Lesions Location(s): None              -Biopsy performed: None              -ECC indicated and performed: Yes.  Os stenotic and scarred, unlikely to  yield adequate sample.     -Biopsy sites made hemostatic with pressure and Monsel's solution   -Satisfactory colposcopy: No.    -Evidence of Invasive cervical CA :  NO  ASSESSMENT:  Sydney French is a 53 y.o. G7P0020 with History of pap smears being unsatisfactory. Prior colposcopies with HSIL, then subsequent LEEPs with CIN1 only. HPV negative throughout pap hx. Here for colposcopy today after pap in May '25 unsatisfactory again, performed as above without complications. No cervical lesions seen. We discussed this may not yield much information due to stenotic cervix. Pt is not interested in hysterectomy. We discussed potentially needing cold knife cone vs continuing colpo/LEEP cycle.  -ECC sent to pathology -Aftercare instructions for home reviewed, si/sx of when to call/return discussed. -Will call with results  VVA & dryness: -Start Estrace : nightly x 2wks, then decrease to twice weekly.  -We discussed potential that topical estrogen may mitigate post-menopausal changes in vagina and cervix.   RLQ pain: just had a colonoscopy and had polyps, but pain preceded colonoscopy and persists afterward.  -Pelvic US  ordered, will call with results.     Sofia Dunn, DO [facility]

## 2024-03-14 ENCOUNTER — Encounter: Payer: Self-pay | Admitting: Obstetrics

## 2024-03-14 ENCOUNTER — Other Ambulatory Visit (HOSPITAL_COMMUNITY)
Admission: RE | Admit: 2024-03-14 | Discharge: 2024-03-14 | Disposition: A | Discharge status: Home / Self Care | Source: Ambulatory Visit | Attending: Obstetrics | Admitting: Obstetrics

## 2024-03-14 ENCOUNTER — Ambulatory Visit (INDEPENDENT_AMBULATORY_CARE_PROVIDER_SITE_OTHER): Admitting: Obstetrics

## 2024-03-14 VITALS — BP 107/63 | HR 97 | Ht 64.0 in | Wt 110.0 lb

## 2024-03-14 DIAGNOSIS — R87615 Unsatisfactory cytologic smear of cervix: Secondary | ICD-10-CM | POA: Diagnosis not present

## 2024-03-14 DIAGNOSIS — Z3202 Encounter for pregnancy test, result negative: Secondary | ICD-10-CM

## 2024-03-14 DIAGNOSIS — Z1151 Encounter for screening for human papillomavirus (HPV): Secondary | ICD-10-CM | POA: Diagnosis not present

## 2024-03-14 DIAGNOSIS — N952 Postmenopausal atrophic vaginitis: Secondary | ICD-10-CM

## 2024-03-14 DIAGNOSIS — N879 Dysplasia of cervix uteri, unspecified: Secondary | ICD-10-CM | POA: Diagnosis not present

## 2024-03-14 DIAGNOSIS — N951 Menopausal and female climacteric states: Secondary | ICD-10-CM

## 2024-03-14 DIAGNOSIS — R1031 Right lower quadrant pain: Secondary | ICD-10-CM

## 2024-03-14 LAB — POCT URINE PREGNANCY: Preg Test, Ur: NEGATIVE

## 2024-03-14 MED ORDER — ESTRADIOL 0.1 MG/GM VA CREA: 1.0000 | TOPICAL_CREAM | Freq: Every day | VAGINAL | 12 refills | Status: AC

## 2024-03-14 NOTE — Patient Instructions (Signed)
 Colposcopy, Care After  The following information offers guidance on how to care for yourself after your procedure. Your doctor may also give you more specific instructions. If you have problems or questions, contact your doctor. What can I expect after the procedure? If you did not have a sample of your tissue taken out (did not have a biopsy), you may only have some spotting of blood for a few days. You can go back to your normal activities. If you had a sample of your tissue taken out, it is common to have: Soreness and mild pain. These may last for a few days. Mild bleeding or fluid (discharge) coming from your vagina. The fluid will look dark and grainy. You may have this for a few days. The fluid may be caused by a liquid that was used during your procedure. You may need to wear a sanitary pad. Spotting of blood for at least 48 hours after the procedure. Follow these instructions at home: Medicines Take over-the-counter and prescription medicines only as told by your doctor. Ask your doctor what over-the-counter pain medicines and prescription medicines you can start taking again. This is very important if you take blood thinners. Activity For at least 3 days, or for as long as told by your doctor, avoid: Douching. Using tampons. Having sex. Return to your normal activities as told by your doctor. Ask your doctor what activities are safe for you. General instructions Ask your doctor if you may take baths, swim, or use a hot tub. You may take showers. If you use birth control (contraception), keep using it. Keep all follow-up visits. Contact a doctor if: You have a fever or chills. You faint or feel light-headed. Get help right away if: You bleed a lot from your vagina. A lot of bleeding means that the bleeding soaks through a pad in less than 1 hour. You have clumps of blood (blood clots) coming from your vagina. You have signs that could mean you have an infection. This may be  fluid coming from your vagina that is: Different than normal. Yellow. Bad-smelling. You have very bad pain or cramps in your lower belly that do not get better with medicine. Summary If you did not have a sample of your tissue taken out, you may only have some spotting of blood for a few days. You can go back to your normal activities. If you had a sample of your tissue taken out, it is common to have mild pain for a few days and spotting for 48 hours. Avoid douching, using tampons, and having sex for at least 3 days after the procedure or for as long as told. Get help right away if you have a lot of bleeding, very bad pain, or signs of infection. This information is not intended to replace advice given to you by your health care provider. Make sure you discuss any questions you have with your health care provider. Document Revised: 02/17/2021 Document Reviewed: 02/17/2021 Elsevier Patient Education  2024 [employer].

## 2024-03-16 DIAGNOSIS — M25562 Pain in left knee: Secondary | ICD-10-CM | POA: Diagnosis not present

## 2024-03-16 DIAGNOSIS — F4310 Post-traumatic stress disorder, unspecified: Secondary | ICD-10-CM | POA: Diagnosis not present

## 2024-03-17 ENCOUNTER — Ambulatory Visit: Payer: Self-pay | Admitting: Obstetrics

## 2024-03-17 LAB — SURGICAL PATHOLOGY

## 2024-03-21 DIAGNOSIS — M25562 Pain in left knee: Secondary | ICD-10-CM | POA: Diagnosis not present

## 2024-03-24 DIAGNOSIS — M25562 Pain in left knee: Secondary | ICD-10-CM | POA: Diagnosis not present

## 2024-03-24 DIAGNOSIS — F4310 Post-traumatic stress disorder, unspecified: Secondary | ICD-10-CM | POA: Diagnosis not present

## 2024-03-29 NOTE — Progress Notes (Unsigned)
 HPI:  Sydney French is a 53 y.o.  G7P0020  who presents today for repeat ECC. ECC was done on 03/14/24 during colposcopy, no cervical lesions seen and no cervical bx taken. ECC pathology resulted: FINAL MICROSCOPIC DIAGNOSIS:   A. ENDOCERVIX, CURETTAGE:  -Few minute atypical squamous fragments, cannot exclude high-grade  squamous intraepithelial lesion (H SIL), recommend additional tissue.   Prior pap smears:  Date:02/26/24 Eje:[id number]         HPV: NEGATIVE         Date:05/05/23 Eje:[id number]  Date:02/24/23 Eje:[id number]         YEC:WZHJUPCZ Date: 02/21/22 Pap: UNSATISFACTORY        Date: 01/09/22 Pap: UNSATISFACTORYHPV: NEG Date: 11/28/21 Pap: UNSATISFACTORYHPV: NEG   Prior cervical / vaginal findings: Colpo Date:05/05/23 Results: 9:00 HG SIL; ECC: mucin only, non-diagnostic Date:04/09/22 Results: 6:00bx (HSIL, CIN-3) ZRR:azwphw endocervical epithelium showing atrophy   Prior cervical treatment(s): LEEP Date: 06/22/23 LEEP Results: CIN 1; ECC neg Date:04/28/22 LEEP Results:CIN 1, ECC: no proper endocervical tissue seen for eval  Symptoms/History:  -Abnormal vaginal discharge: no -Postmenopausal: yes -Intermenstrual bleeding: no -Postcoital bleeding: no -Bleeding problems (non-gyn): no -Contraception: none  -Number of current sexual partners: 1 -Number of partners in lifetime: 1 -History of a high risk partner: no -History of STDs: no -Smoking: no -Gardasil Vaccine: no                                                ROS:  Pertinent items are noted in HPI.  OB History  Gravida Para Term Preterm AB Living  7 5   2    SAB IAB Ectopic Multiple Live Births  2        # Outcome Date GA Lbr Len/2nd Weight Sex Type Anes PTL Lv  7 Para 2003     Vag-Spont     6 Para 2002     Vag-Spont     5 Para 1998     Vag-Spont     4 SAB 1997          3 Para 1995     Vag-Spont     2 Para 1993     CS-Unspec     1 SAB             Past Medical History:  Diagnosis Date    Endometriosis    History of ovarian cyst    Irregular periods    Osteoarthritis     Past Surgical History:  Procedure Laterality Date   CESAREAN SECTION     child birth natural     1995, 1998, 2003   CHOLECYSTECTOMY  69   DILATION AND CURETTAGE OF UTERUS     GALLBLADDER SURGERY      SOCIAL HISTORY:  Social History   Substance and Sexual Activity  Alcohol Use No    Social History   Substance and Sexual Activity  Drug Use No     Family History  Problem Relation Age of Onset   Melanoma Mother    Cancer Mother    Heart attack Father    Melanoma Brother    Asthma Neg Hx    Breast cancer Neg Hx    Stroke Neg Hx    Glaucoma Neg Hx    Arthritis Neg Hx     ALLERGIES:  Patient has no known allergies.  She  has a current medication list which includes the following prescription(s): diphenhydramine hcl, ferrous fumarate , ferrous sulfate, ibuprofen, multivitamin, tretinoin , and estradiol .  Physical Exam: -Vitals:  BP 115/60   Pulse 81   Ht 5' 4 (1.626 m)   Wt 109 lb (49.4 kg)   BMI 18.71 kg/m   PROCEDURE: Cervix: flush with vaginal walls and culdesacs after LEEPs, scar tissue apparent and os not patent, is also flush with surrounding tissue.   Biopsy taken from where central canal would be. Site made hemostatic with pressure and Monsel's solution.   ASSESSMENT:  Sydney French is a 53 y.o. [phone] with history of unsatisfactory pap smears, with subsequent colposcopies showing HSIL, then LEEPs with CIN1 only. Last colposcopy without visible lesions, no cervical bx taken, but ECC was insufficient and couldn't rule out H-SIL. ECC region re-biopsied today with biopsy tool due to stenosis/scarring and sent to pathology. Aftercare instructions for home reviewed, si/sx of when to call/return discussed. Will call with results.    Estil Mangle, DO [facility]

## 2024-03-30 ENCOUNTER — Ambulatory Visit: Admitting: Obstetrics

## 2024-03-30 ENCOUNTER — Ambulatory Visit

## 2024-03-30 ENCOUNTER — Other Ambulatory Visit (HOSPITAL_COMMUNITY)
Admission: RE | Admit: 2024-03-30 | Discharge: 2024-03-30 | Disposition: A | Discharge status: Home / Self Care | Source: Ambulatory Visit | Attending: Obstetrics | Admitting: Obstetrics

## 2024-03-30 VITALS — BP 115/60 | HR 81 | Ht 64.0 in | Wt 109.0 lb

## 2024-03-30 DIAGNOSIS — R1031 Right lower quadrant pain: Secondary | ICD-10-CM

## 2024-03-30 DIAGNOSIS — R102 Pelvic and perineal pain: Secondary | ICD-10-CM | POA: Diagnosis not present

## 2024-03-30 DIAGNOSIS — R87615 Unsatisfactory cytologic smear of cervix: Secondary | ICD-10-CM | POA: Diagnosis not present

## 2024-03-30 DIAGNOSIS — N888 Other specified noninflammatory disorders of cervix uteri: Secondary | ICD-10-CM | POA: Diagnosis not present

## 2024-03-30 DIAGNOSIS — F4310 Post-traumatic stress disorder, unspecified: Secondary | ICD-10-CM | POA: Diagnosis not present

## 2024-03-31 DIAGNOSIS — M25562 Pain in left knee: Secondary | ICD-10-CM | POA: Diagnosis not present

## 2024-04-03 ENCOUNTER — Encounter: Payer: Self-pay | Admitting: Nurse Practitioner

## 2024-04-04 ENCOUNTER — Ambulatory Visit: Payer: Self-pay | Admitting: Obstetrics

## 2024-04-04 LAB — SURGICAL PATHOLOGY

## 2024-04-07 DIAGNOSIS — F4310 Post-traumatic stress disorder, unspecified: Secondary | ICD-10-CM | POA: Diagnosis not present

## 2024-04-13 DIAGNOSIS — F4310 Post-traumatic stress disorder, unspecified: Secondary | ICD-10-CM | POA: Diagnosis not present

## 2024-04-20 DIAGNOSIS — F4310 Post-traumatic stress disorder, unspecified: Secondary | ICD-10-CM | POA: Diagnosis not present

## 2024-04-21 DIAGNOSIS — M25562 Pain in left knee: Secondary | ICD-10-CM | POA: Diagnosis not present

## 2024-04-27 DIAGNOSIS — F4310 Post-traumatic stress disorder, unspecified: Secondary | ICD-10-CM | POA: Diagnosis not present

## 2024-05-04 DIAGNOSIS — F4310 Post-traumatic stress disorder, unspecified: Secondary | ICD-10-CM | POA: Diagnosis not present

## 2024-05-11 DIAGNOSIS — F4310 Post-traumatic stress disorder, unspecified: Secondary | ICD-10-CM | POA: Diagnosis not present

## 2024-05-18 DIAGNOSIS — F4310 Post-traumatic stress disorder, unspecified: Secondary | ICD-10-CM | POA: Diagnosis not present

## 2024-05-25 DIAGNOSIS — F4310 Post-traumatic stress disorder, unspecified: Secondary | ICD-10-CM | POA: Diagnosis not present

## 2024-06-01 DIAGNOSIS — F4310 Post-traumatic stress disorder, unspecified: Secondary | ICD-10-CM | POA: Diagnosis not present

## 2024-06-09 DIAGNOSIS — F4310 Post-traumatic stress disorder, unspecified: Secondary | ICD-10-CM | POA: Diagnosis not present

## 2024-06-15 DIAGNOSIS — F4310 Post-traumatic stress disorder, unspecified: Secondary | ICD-10-CM | POA: Diagnosis not present

## 2024-06-22 DIAGNOSIS — F4310 Post-traumatic stress disorder, unspecified: Secondary | ICD-10-CM | POA: Diagnosis not present

## 2024-06-28 DIAGNOSIS — F4310 Post-traumatic stress disorder, unspecified: Secondary | ICD-10-CM | POA: Diagnosis not present

## 2024-07-07 DIAGNOSIS — F4310 Post-traumatic stress disorder, unspecified: Secondary | ICD-10-CM | POA: Diagnosis not present

## 2024-07-13 DIAGNOSIS — F4310 Post-traumatic stress disorder, unspecified: Secondary | ICD-10-CM | POA: Diagnosis not present

## 2024-07-20 DIAGNOSIS — F4310 Post-traumatic stress disorder, unspecified: Secondary | ICD-10-CM | POA: Diagnosis not present

## 2024-07-26 DIAGNOSIS — F4310 Post-traumatic stress disorder, unspecified: Secondary | ICD-10-CM | POA: Diagnosis not present

## 2024-08-17 DIAGNOSIS — F4310 Post-traumatic stress disorder, unspecified: Secondary | ICD-10-CM | POA: Diagnosis not present

## 2024-08-24 DIAGNOSIS — F4310 Post-traumatic stress disorder, unspecified: Secondary | ICD-10-CM | POA: Diagnosis not present

## 2025-02-28 ENCOUNTER — Encounter: Admitting: Nurse Practitioner
